# Patient Record
Sex: Female | Born: 1940
Health system: Southern US, Community
[De-identification: ages and names within clinical notes are randomized; demographics above are authoritative.]

## PROBLEM LIST (undated history)

## (undated) DIAGNOSIS — IMO0001 Reserved for inherently not codable concepts without codable children: Secondary | ICD-10-CM

## (undated) DIAGNOSIS — D469 Myelodysplastic syndrome, unspecified: Secondary | ICD-10-CM

## (undated) DIAGNOSIS — K279 Peptic ulcer, site unspecified, unspecified as acute or chronic, without hemorrhage or perforation: Secondary | ICD-10-CM

## (undated) DIAGNOSIS — M47812 Spondylosis without myelopathy or radiculopathy, cervical region: Secondary | ICD-10-CM

## (undated) DIAGNOSIS — M1990 Unspecified osteoarthritis, unspecified site: Secondary | ICD-10-CM

## (undated) DIAGNOSIS — I10 Essential (primary) hypertension: Secondary | ICD-10-CM

## (undated) DIAGNOSIS — S82892A Other fracture of left lower leg, initial encounter for closed fracture: Secondary | ICD-10-CM

## (undated) DIAGNOSIS — M5481 Occipital neuralgia: Secondary | ICD-10-CM

## (undated) DIAGNOSIS — E669 Obesity, unspecified: Secondary | ICD-10-CM

## (undated) DIAGNOSIS — R51 Headache: Secondary | ICD-10-CM

## (undated) DIAGNOSIS — E785 Hyperlipidemia, unspecified: Secondary | ICD-10-CM

## (undated) DIAGNOSIS — R269 Unspecified abnormalities of gait and mobility: Secondary | ICD-10-CM

## (undated) DIAGNOSIS — E559 Vitamin D deficiency, unspecified: Secondary | ICD-10-CM

## (undated) HISTORY — DX: Obesity, unspecified: E66.9

## (undated) HISTORY — PX: LUMBAR LAMINECTOMY: SHX95

## (undated) HISTORY — PX: DILATION AND CURETTAGE OF UTERUS: SHX78

## (undated) HISTORY — PX: ANKLE FRACTURE SURGERY: SHX122

## (undated) HISTORY — DX: Other fracture of left lower leg, initial encounter for closed fracture: S82.892A

## (undated) HISTORY — PX: APPENDECTOMY: SHX54

## (undated) HISTORY — DX: Myelodysplastic syndrome, unspecified: D46.9

## (undated) HISTORY — PX: ROTATOR CUFF REPAIR: SHX139

## (undated) HISTORY — DX: Essential (primary) hypertension: I10

## (undated) HISTORY — DX: Reserved for inherently not codable concepts without codable children: IMO0001

## (undated) HISTORY — PX: ABDOMINAL HYSTERECTOMY: SHX81

## (undated) HISTORY — PX: TUBAL LIGATION: SHX77

## (undated) HISTORY — DX: Unspecified abnormalities of gait and mobility: R26.9

## (undated) HISTORY — DX: Spondylosis without myelopathy or radiculopathy, cervical region: M47.812

## (undated) HISTORY — DX: Unspecified osteoarthritis, unspecified site: M19.90

## (undated) HISTORY — DX: Headache: R51

## (undated) HISTORY — DX: Hyperlipidemia, unspecified: E78.5

## (undated) HISTORY — PX: BREAST LUMPECTOMY: SHX2

## (undated) HISTORY — PX: CATARACT EXTRACTION, BILATERAL: SHX1313

## (undated) HISTORY — DX: Occipital neuralgia: M54.81

## (undated) HISTORY — DX: Peptic ulcer, site unspecified, unspecified as acute or chronic, without hemorrhage or perforation: K27.9

## (undated) HISTORY — PX: TONSILLECTOMY: SUR1361

## (undated) HISTORY — PX: LUMBAR SPINE SURGERY: SHX701

## (undated) HISTORY — DX: Vitamin D deficiency, unspecified: E55.9

---

## 2011-01-20 ENCOUNTER — Other Ambulatory Visit: Payer: Self-pay | Admitting: Oncology

## 2011-01-20 ENCOUNTER — Other Ambulatory Visit (HOSPITAL_COMMUNITY)
Admission: RE | Admit: 2011-01-20 | Discharge: 2011-01-20 | Disposition: A | Discharge status: Home / Self Care | Payer: PRIVATE HEALTH INSURANCE | Source: Ambulatory Visit | Attending: Oncology | Admitting: Oncology

## 2011-01-20 DIAGNOSIS — D709 Neutropenia, unspecified: Secondary | ICD-10-CM | POA: Insufficient documentation

## 2011-01-20 DIAGNOSIS — D61818 Other pancytopenia: Secondary | ICD-10-CM | POA: Insufficient documentation

## 2011-01-20 DIAGNOSIS — D72820 Lymphocytosis (symptomatic): Secondary | ICD-10-CM | POA: Insufficient documentation

## 2011-10-22 DIAGNOSIS — G43019 Migraine without aura, intractable, without status migrainosus: Secondary | ICD-10-CM | POA: Insufficient documentation

## 2011-10-22 DIAGNOSIS — IMO0002 Reserved for concepts with insufficient information to code with codable children: Secondary | ICD-10-CM | POA: Insufficient documentation

## 2011-11-01 HISTORY — PX: THORACIC LAMINECTOMY: SHX96

## 2012-06-08 ENCOUNTER — Encounter: Payer: Self-pay | Admitting: Neurology

## 2012-06-08 ENCOUNTER — Ambulatory Visit (INDEPENDENT_AMBULATORY_CARE_PROVIDER_SITE_OTHER): Payer: Medicare Other | Admitting: Neurology

## 2012-06-08 VITALS — BP 106/63 | HR 72 | Ht <= 58 in | Wt 134.0 lb

## 2012-06-08 DIAGNOSIS — IMO0002 Reserved for concepts with insufficient information to code with codable children: Secondary | ICD-10-CM

## 2012-06-08 DIAGNOSIS — G43019 Migraine without aura, intractable, without status migrainosus: Secondary | ICD-10-CM

## 2012-06-08 MED ORDER — NADOLOL 40 MG PO TABS: ORAL_TABLET | ORAL | Status: DC

## 2012-06-08 MED ORDER — BUTORPHANOL TARTRATE 10 MG/ML NA SOLN: 1.0000 | NASAL | Status: DC | PRN

## 2012-06-08 NOTE — Progress Notes (Signed)
Reason for visit: Headache  Kristin Mendez is an 72 y.o. female  History of present illness:  Kristin Mendez is a 72 year old right-handed white female with a history of chronic daily headaches. The patient has had thoracic spine surgery in September of 2013 since she was seen here last. The patient has had ongoing problems with some discomfort around the right side. The patient continues to have her usual headaches, and she indicates that the headaches have not worsened or improved. Previously, the patient did not respond to Botox therapy, and she could not tolerate the treatments. The patient is on Stadol if needed for her headache. The patient returns to this office for an evaluation.  Past Medical History  Diagnosis Date  . Headache     Chronic daily  . Obesity   . Hypertension   . Myelodysplasia   . Dyslipidemia   . Occipital neuralgia     Right  . Peptic ulcer disease   . Ankle fracture, left   . Arthritis     Past Surgical History  Procedure Laterality Date  . Lumbar spine surgery      5 on occasions  . Abdominal hysterectomy    . Dilation and curettage of uterus    . Tubal ligation Bilateral   . Breast lumpectomy    . Tonsillectomy    . Appendectomy    . Ankle fracture surgery Left   . Rotator cuff repair Left   . Cataract extraction, bilateral    . Lumbar laminectomy      Upper pending  . Thoracic laminectomy  9/13    Family History  Problem Relation Age of Onset  . Alzheimer's disease Mother   . Heart failure Mother   . Heart attack Father   . Diabetes Brother   . Bipolar disorder Brother   . Stroke Maternal Grandfather     Social history:  reports that she has never smoked. She does not have any smokeless tobacco history on file. She reports that she does not drink alcohol or use illicit drugs.  Allergies:  Allergies  Allergen Reactions  . Codeine   . Lyrica (Pregabalin)   . Novocain (Procaine Hcl)     Medications:  No current outpatient  prescriptions on file prior to visit.   No current facility-administered medications on file prior to visit.    ROS:  Out of a complete 14 system review of symptoms, the patient complains only of the following symptoms, and all other reviewed systems are negative.  Ringing in the ears Difficulty swallowing Anemia Easy bruising Increased thirst Headache Snoring  Blood pressure 106/63, pulse 72, height 4\' 9"  (1.448 m), weight 134 lb (60.782 kg).  Physical Exam  General: The patient is alert and cooperative at the time of the examination. The patient is minimally obese.  Skin: No significant peripheral edema is noted.   Neurologic Exam  Cranial nerves: Facial symmetry is present. Speech is normal, no aphasia or dysarthria is noted. Extraocular movements are full. Visual fields are full.  Motor: The patient has good strength in all 4 extremities.  Coordination: The patient has good finger-nose-finger and heel-to-shin bilaterally.  Gait and station: The patient has a normal gait. Tandem gait is slightly unsteady. Romberg is negative, but is slightly unsteady. No drift is seen.  Reflexes: Deep tendon reflexes are symmetric, but are depressed.   Assessment/Plan:  One. Chronic daily headache  2. Myelodysplasia  The patient will continue her current therapies including doxepin and Stadol for  the headache. The patient will followup through this office in about 8 or 9 months. The patient will contact me if she has any concerns or questions.  Marlan Palau MD 06/08/2012 8:27 PM  [facility] [address] [place], [place] [address]  Phone [phone] Fax [phone]

## 2012-06-09 ENCOUNTER — Telehealth: Payer: Self-pay

## 2012-06-09 MED ORDER — BUTORPHANOL TARTRATE 10 MG/ML NA SOLN: 1.0000 | NASAL | Status: DC | PRN

## 2012-06-09 NOTE — Telephone Encounter (Signed)
I got a call from the pharmacy. The prescription for the Stadol was not written properly, the patient is to get no more than 3 bottles of Stadol every 14 days.

## 2012-06-09 NOTE — Telephone Encounter (Signed)
Pharmacy called and left message wanting to clarify Stadol Rx.  They say her previous rx was for 3 bottles per fill, and new rx sent indicates they should dispense 9 bottles. They would like to verify the amount of bottles they should dispense prior to filling medication.  Call back number [phone].  Please advise.  Thank you.

## 2012-08-30 ENCOUNTER — Other Ambulatory Visit: Payer: Self-pay | Admitting: Neurology

## 2012-08-31 ENCOUNTER — Other Ambulatory Visit: Payer: Self-pay

## 2012-08-31 MED ORDER — BUTORPHANOL TARTRATE 10 MG/ML NA SOLN: 1.0000 | NASAL | Status: DC | PRN

## 2012-11-23 ENCOUNTER — Other Ambulatory Visit: Payer: Self-pay | Admitting: Neurology

## 2012-11-23 NOTE — Telephone Encounter (Signed)
Rx signed and faxed.

## 2013-02-08 ENCOUNTER — Encounter: Payer: Self-pay | Admitting: Neurology

## 2013-02-08 ENCOUNTER — Ambulatory Visit (INDEPENDENT_AMBULATORY_CARE_PROVIDER_SITE_OTHER): Payer: Medicare Other | Admitting: Neurology

## 2013-02-08 VITALS — BP 112/64 | HR 66 | Wt 137.0 lb

## 2013-02-08 DIAGNOSIS — G43019 Migraine without aura, intractable, without status migrainosus: Secondary | ICD-10-CM

## 2013-02-08 DIAGNOSIS — IMO0002 Reserved for concepts with insufficient information to code with codable children: Secondary | ICD-10-CM

## 2013-02-08 DIAGNOSIS — M47812 Spondylosis without myelopathy or radiculopathy, cervical region: Secondary | ICD-10-CM

## 2013-02-08 HISTORY — DX: Spondylosis without myelopathy or radiculopathy, cervical region: M47.812

## 2013-02-08 MED ORDER — BUTORPHANOL TARTRATE 10 MG/ML NA SOLN: 1.0000 | NASAL | Status: DC | PRN

## 2013-02-08 NOTE — Progress Notes (Signed)
Reason for visit: Headache  Kristin Mendez is an 72 y.o. female  History of present illness:  Kristin Mendez is a 72 year old right-handed white female with a history of chronic, virtually daily headache. The patient also has significant cervical spondylosis, and many of her headaches come from the back of the neck. The patient indicates that her headaches are no worse and no better. In the past, the patient got ptosis from Botox injections. The patient is on Stadol on a regular basis to help the pain. This seems to be controlling her discomfort over time. The patient reports no other significant new medical issues since last seen with the exception that she did have an episode of bronchitis. The patient returns for an evaluation.  Past Medical History  Diagnosis Date  . Headache(784.0)     Chronic daily  . Obesity   . Hypertension   . Myelodysplasia   . Dyslipidemia   . Occipital neuralgia     Right  . Peptic ulcer disease   . Ankle fracture, left   . Arthritis   . Cervical spondylosis without myelopathy 02/08/2013  . Vitamin D deficiency     Past Surgical History  Procedure Laterality Date  . Lumbar spine surgery      5 on occasions  . Abdominal hysterectomy    . Dilation and curettage of uterus    . Tubal ligation Bilateral   . Breast lumpectomy    . Tonsillectomy    . Appendectomy    . Ankle fracture surgery Left   . Rotator cuff repair Left   . Cataract extraction, bilateral    . Lumbar laminectomy      Upper pending  . Thoracic laminectomy  9/13    Family History  Problem Relation Age of Onset  . Alzheimer's disease Mother   . Heart failure Mother   . Heart attack Father   . Diabetes Brother   . Bipolar disorder Brother   . Stroke Maternal Grandfather     Social history:  reports that she has never smoked. She has never used smokeless tobacco. She reports that she does not drink alcohol or use illicit drugs.    Allergies  Allergen Reactions  . Codeine     . Lyrica [Pregabalin]   . Novocain [Procaine Hcl]     Medications:  Current Outpatient Prescriptions on File Prior to Visit  Medication Sig Dispense Refill  . amLODipine (NORVASC) 5 MG tablet Take 5 mg by mouth daily.      . Calcium Polycarbophil (EQUALACTIN) 625 MG CHEW Chew 625 mg by mouth 2 (two) times daily.      Marland Kitchen dicyclomine (BENTYL) 20 MG tablet Take 20 mg by mouth 3 (three) times daily as needed.      . doxepin (SINEQUAN) 100 MG capsule Take 100 mg by mouth at bedtime.      Marland Kitchen doxepin (SINEQUAN) 25 MG capsule Take 25 mg by mouth 2 (two) times daily.      Marland Kitchen HYDROcodone-acetaminophen (VICODIN) 5-500 MG per tablet Take 1 tablet by mouth every 6 (six) hours as needed for pain.      . hydrOXYzine (ATARAX/VISTARIL) 25 MG tablet Take 25 mg by mouth every 6 (six) hours as needed for itching.      . mometasone (NASONEX) 50 MCG/ACT nasal spray Place 2 sprays into the nose daily.      . nadolol (CORGARD) 40 MG tablet 1 tablet in the morning, one half tablet in the evening      .  nitroGLYCERIN (NITROSTAT) 0.4 MG SL tablet Place 0.4 mg under the tongue every 5 (five) minutes as needed for chest pain.      Marland Kitchen omeprazole (PRILOSEC OTC) 20 MG tablet Take 20 mg by mouth daily.      . rosuvastatin (CRESTOR) 10 MG tablet Take 10 mg by mouth daily.      . ergocalciferol (VITAMIN D2) 50000 UNITS capsule Take 50,000 Units by mouth once a week.      Marland Kitchen ibuprofen (ADVIL,MOTRIN) 200 MG tablet Take 200 mg by mouth every 6 (six) hours as needed for pain.       No current facility-administered medications on file prior to visit.    ROS:  Out of a complete 14 system review of symptoms, the patient complains only of the following symptoms, and all other reviewed systems are negative.  Neck stiffness, neck pain, ringing in the ears Leg swelling Heat intolerance Constipation Joint pain, back pain, neck pain Headache  Blood pressure 112/64, pulse 66, weight 137 lb (62.143 kg).  Physical Exam  General:  The patient is alert and cooperative at the time of the examination. The patient is moderately obese.  Neuromuscular: Range of movement of the cervical spine is limited by 20-25 of full lateral rotation bilaterally.  Skin: 1+ edema of ankles is noted bilaterally.   Neurologic Exam  Mental status: The patient is oriented x 3.  Cranial nerves: Facial symmetry is present. Speech is normal, no aphasia or dysarthria is noted. Extraocular movements are full. Visual fields are full.  Motor: The patient has good strength in all 4 extremities.  Sensory examination: Soft touch sensation on the face, arms, and legs is symmetric.  Coordination: The patient has good finger-nose-finger and heel-to-shin bilaterally.  Gait and station: The patient has a normal gait. Tandem gait is unsteady. Romberg is negative, but is unsteady. No drift is seen.  Reflexes: Deep tendon reflexes are symmetric.   Assessment/Plan:  1. Chronic daily headache  2. Cervical spondylosis  The patient may have some degree of cervicogenic headache. The patient could potentially benefit from a pain center referral in the future, but she is not interested in this at this time. The patient will be continued on the Stadol. The patient will followup through this office in 6-8 months.  Marlan Palau MD 02/08/2013 7:59 PM  [facility] [address] [place], [place] [address]  Phone [phone] Fax [phone]

## 2013-02-08 NOTE — Patient Instructions (Signed)

## 2013-03-24 ENCOUNTER — Telehealth: Payer: Self-pay

## 2013-03-24 ENCOUNTER — Encounter: Payer: Self-pay | Admitting: Neurology

## 2013-03-24 NOTE — Telephone Encounter (Signed)
I called patient. The patient has been on Stadol for 15-20 years. The patient has had good success with treating her headaches. The patient has hydrocodone, but this does not help her headache the patient mainly uses this for her low back pain. I'll try writing a letter concerning this, hopefully overturning the denial. If this does not work, we may try switching the patient to oxycodone or morphine tablets. Codeine is listed as an allergy, this causes nausea.

## 2013-03-24 NOTE — Telephone Encounter (Signed)
Ins mailed [place] a letter of denial.  They state they will only pay for 2 bottles of Stadol per month, and will not approve a higher quantity because the dose is not supported by the manufacturers prescribing info or the Medicare guidelines.  We have even sent them a letter written by the prescriber, but they have still declined the request.  I called and spoke to Kristin Mendez, and he is aware of their decision.  He would like to know if Dr Jannifer Franklin can recommend a different medication for the patient.  Please advise.  Thank you.

## 2013-03-27 NOTE — Telephone Encounter (Signed)
Appeal letter has been sent to ins.

## 2013-03-31 ENCOUNTER — Telehealth: Payer: Self-pay | Admitting: Neurology

## 2013-03-31 NOTE — Telephone Encounter (Addendum)
Called and left message for return call

## 2013-03-31 NOTE — Telephone Encounter (Signed)
This is regarding the appeal for Stadol.  Insurers often ask for chart visit notes to submit to the medical review board.  I called Lorriane Shire back to obtain the fax number they would like the info sent to.  Got no answer.  Left message asking for a return call with this info.

## 2013-03-31 NOTE — Telephone Encounter (Signed)
Lorriane Shire with [employer] called regarding Ms. Holthaus's pain medicine.  They are wanting to make sure that they are being managed properly, because it appears to them that she is on a high dosage.  Lorriane Shire is requesting chart notes from the patient's last office visit.  I did inform her that we may need the patient to come into the office and fill out a form to allow [place] to send that information to them.  Please contact Lorriane Shire directly at [phone].

## 2013-04-03 NOTE — Telephone Encounter (Signed)
I have still not heard back from [place], so I called again.  Got no answer.  Left another message.  Since I am unable to reach her to find out what fax number she needs the info sent to, I am going to fax it to the appeal dept.  I left this info in her message and asked that she call us back if there is a direct fax line we should send it to instead.

## 2013-04-13 ENCOUNTER — Telehealth: Payer: Self-pay | Admitting: Neurology

## 2013-04-13 NOTE — Telephone Encounter (Signed)
Patient's husband is requesting a copy of the letter that was sent to Dr. Jannifer Franklin from the insurance company - they have lost their copy. Pharmacy is showing that butorphanol  it is not fully approved. Please call to advise.

## 2013-04-13 NOTE — Telephone Encounter (Signed)
Insurance mailed [place] a letter saying they have approved coverage on generic Stadol effective until 03/01/2014 Ref # [id number].  The number listed on the letter to call with any questions is [phone].  I called that number, got no answer.  Left message (Kristin Mendez was the agents name) asking that they call us back if they needed another copy of the letter or if anything further is needed from [place].  I called the patient back.  [name] said he gave his copy of the approval letter to the pharmacy and they lost it.  He says the ins was going to pay for 2 bottles monthly, which is why we had to appeal, and he brought the approval letter to the pharmacy, but they told him the ins advised them they only approved 3 bottles per 30 day and they want to be able to get 6 bottles per 30 days.  He wants me to call ins back and verify this info.  The letter does not say how many bttles are approved, it just says approved.  Explained to [name], they may ony allow 3 per month, as the quantity allowed is determined by them, but I will call them again to see if I am able to obtain more info.  I called an alternate number listed in the letter, [phone].  They were not able to assist me, as that line is for medical services only, not prescription drug coverage.  I called Kristin back again.  Spoke with her.  She said the max they would approve is 5 bottles per 30 days.  She tried running a test claim, but said she is having difficulty with her system.  Indicates she will try again later today and if any changes are needed on their end, they will update the info.  I gave her my number and email as a form of contact if needed.  I called the patient back again.  Spoke with [name].  Explained the situation.  He verbalized understanding.

## 2013-04-13 NOTE — Telephone Encounter (Signed)
Patient husband requesting a copy of the insurance letter that was sent concerning the patient's medication butorphanol. Please advise.

## 2013-04-26 ENCOUNTER — Telehealth: Payer: Self-pay | Admitting: Neurology

## 2013-04-26 MED ORDER — BUTORPHANOL TARTRATE 10 MG/ML NA SOLN: 1.0000 | NASAL | Status: DC | PRN

## 2013-04-26 NOTE — Telephone Encounter (Signed)
I will call in a prescription for the Stadol.

## 2013-04-26 NOTE — Telephone Encounter (Signed)
I have tried to call the pharmacy back multiple times, and each time the line has been busy.  Will try again in a few minutes.

## 2013-04-26 NOTE — Telephone Encounter (Signed)
I called and spoke with Erline Levine.  Said she knows we did the prior auth for Stadol.  The patient has been getting 6 bottles per month, and after the appeal, ins said they will pay for 5 bottles per month.  (Patient would like to pay cash for 6th bottle each month).  They need a new Rx written for 6 bottles (15 ml) for a 30 day supply in order for ins to pay.

## 2013-04-26 NOTE — Telephone Encounter (Signed)
Kristin Mendez with [facility] requires 14 days before refill. Can patient get this filled one day early? Tomorrow will be the 14th day. Please call Kristin Mendez back at [phone].

## 2013-05-05 ENCOUNTER — Telehealth: Payer: Self-pay | Admitting: Neurology

## 2013-05-05 MED ORDER — BUTORPHANOL TARTRATE 10 MG/ML NA SOLN: 1.0000 | NASAL | Status: DC | PRN

## 2013-05-05 NOTE — Telephone Encounter (Signed)
Stacy at [facility] called.  She stated that the insurance pays for 5 bottles and the patient would pay for 1 bottle, however the prescription they received at [facility] is for 5 bottles.  Please reference message from 04-13-13.  Thank you

## 2013-05-05 NOTE — Telephone Encounter (Signed)
I called the pharmacy back.  They prefer that we write a separate Rx for the one bottle per month that the patient will pay cash for so it limits the confusion.

## 2013-05-05 NOTE — Telephone Encounter (Signed)
I will write a prescription for one bottle.

## 2013-08-09 ENCOUNTER — Encounter: Payer: Self-pay | Admitting: Neurology

## 2013-08-14 ENCOUNTER — Ambulatory Visit: Payer: Medicare Other | Admitting: Neurology

## 2013-08-23 ENCOUNTER — Ambulatory Visit: Payer: Medicare Other | Admitting: Neurology

## 2013-09-11 ENCOUNTER — Telehealth: Payer: Self-pay | Admitting: *Deleted

## 2013-09-11 NOTE — Telephone Encounter (Signed)
Called patient because she is on the wait list for an appointment with Dr. Jannifer Franklin, offered for patient to see NP MM, patient wanted to see Dr. Jannifer Franklin, she was r/s from 6/23 due to [facility] being out of the office and was scheduled to 02/07/14, [facility] had an opening for 10/26/13 patient was r/s to that time.

## 2013-10-09 ENCOUNTER — Other Ambulatory Visit: Payer: Self-pay | Admitting: Neurology

## 2013-10-09 NOTE — Telephone Encounter (Signed)
Dr [facility] is out of the office.  Forwarding request to [employer] for approval.  

## 2013-10-10 NOTE — Telephone Encounter (Signed)
Rx has been faxed.

## 2013-10-12 ENCOUNTER — Other Ambulatory Visit: Payer: Self-pay | Admitting: Neurology

## 2013-10-12 NOTE — Telephone Encounter (Signed)
Dr Jannifer Franklin is out of the office.  Forwarding request to [facility] for approval.  Patient gets #3 bottles via ins, and pays cash for 1 bottle each month.

## 2013-10-12 NOTE — Telephone Encounter (Signed)
Rx signed and faxed.

## 2013-10-12 NOTE — Telephone Encounter (Signed)
Spouse requesting Rx refill for 1 bottle for butorphanol (STADOL) 10 MG/ML nasal spray.  Pharmacy received Rx for 3 bottles.  Please call and advise anytime, can leave detailed message if not available.  Thanks

## 2013-10-26 ENCOUNTER — Ambulatory Visit (INDEPENDENT_AMBULATORY_CARE_PROVIDER_SITE_OTHER): Payer: Medicare Other | Admitting: Neurology

## 2013-10-26 ENCOUNTER — Encounter: Payer: Self-pay | Admitting: Neurology

## 2013-10-26 VITALS — BP 100/63 | HR 61 | Wt 140.0 lb

## 2013-10-26 DIAGNOSIS — G43019 Migraine without aura, intractable, without status migrainosus: Secondary | ICD-10-CM

## 2013-10-26 DIAGNOSIS — IMO0002 Reserved for concepts with insufficient information to code with codable children: Secondary | ICD-10-CM

## 2013-10-26 DIAGNOSIS — M47812 Spondylosis without myelopathy or radiculopathy, cervical region: Secondary | ICD-10-CM

## 2013-10-26 MED ORDER — BUTORPHANOL TARTRATE 10 MG/ML NA SOLN: 1.0000 | NASAL | Status: DC | PRN

## 2013-10-26 NOTE — Patient Instructions (Signed)

## 2013-10-26 NOTE — Progress Notes (Signed)
Reason for visit: Headache  Kristin Mendez is an 73 y.o. female  History of present illness:  Kristin Mendez is a 73 year old right-handed white female with a history of chronic intractable headache. The patient uses Stadol on a regular basis with some benefit, she is able to function using this drug. The patient has had no real change in her headache frequency or severity. The patient is able to remain somewhat active. The patient has myelodysplasia, and she is being treated for this. The patient reports a lot of fatigue issues, but this is a chronic problem for her. She returns for an evaluation.  Past Medical History  Diagnosis Date  . Headache(784.0)     Chronic daily  . Obesity   . Hypertension   . Myelodysplasia   . Dyslipidemia   . Occipital neuralgia     Right  . Peptic ulcer disease   . Ankle fracture, left   . Arthritis   . Cervical spondylosis without myelopathy 02/08/2013  . Vitamin D deficiency     Past Surgical History  Procedure Laterality Date  . Lumbar spine surgery      5 on occasions  . Abdominal hysterectomy    . Dilation and curettage of uterus    . Tubal ligation Bilateral   . Breast lumpectomy    . Tonsillectomy    . Appendectomy    . Ankle fracture surgery Left   . Rotator cuff repair Left   . Cataract extraction, bilateral    . Lumbar laminectomy      Upper pending  . Thoracic laminectomy  9/13    Family History  Problem Relation Age of Onset  . Alzheimer's disease Mother   . Heart failure Mother   . Heart attack Father   . Diabetes Brother   . Bipolar disorder Brother   . Stroke Maternal Grandfather     Social history:  reports that she has never smoked. She has never used smokeless tobacco. She reports that she does not drink alcohol or use illicit drugs.    Allergies  Allergen Reactions  . Codeine   . Lyrica [Pregabalin]   . Novocain [Procaine Hcl]     Medications:  Current Outpatient Prescriptions on File Prior to Visit    Medication Sig Dispense Refill  . amLODipine (NORVASC) 5 MG tablet Take 5 mg by mouth daily.      . butorphanol (STADOL) 10 MG/ML nasal spray Place 1 spray into the nose every 4 (four) hours as needed for headache (Must last 28 days).  2.5 mL  1  . Calcium Polycarbophil (EQUALACTIN) 625 MG CHEW Chew 625 mg by mouth 2 (two) times daily.      . cholecalciferol (VITAMIN D) 1000 UNITS tablet Take 1,000 Units by mouth daily.      Marland Kitchen dicyclomine (BENTYL) 20 MG tablet Take 20 mg by mouth 3 (three) times daily as needed.      . doxepin (SINEQUAN) 100 MG capsule Take 100 mg by mouth at bedtime.      Marland Kitchen doxepin (SINEQUAN) 25 MG capsule Take 25 mg by mouth 2 (two) times daily.      . ergocalciferol (VITAMIN D2) 50000 UNITS capsule Take 50,000 Units by mouth once a week.      Marland Kitchen HYDROcodone-acetaminophen (VICODIN) 5-500 MG per tablet Take 1 tablet by mouth every 6 (six) hours as needed for pain.      . hydrOXYzine (ATARAX/VISTARIL) 25 MG tablet Take 25 mg by mouth every 6 (  six) hours as needed for itching.      Marland Kitchen ibuprofen (ADVIL,MOTRIN) 200 MG tablet Take 200 mg by mouth every 6 (six) hours as needed for pain.      . mometasone (NASONEX) 50 MCG/ACT nasal spray Place 2 sprays into the nose daily.      . nadolol (CORGARD) 40 MG tablet 1 tablet in the morning, one half tablet in the evening      . nitroGLYCERIN (NITROSTAT) 0.4 MG SL tablet Place 0.4 mg under the tongue every 5 (five) minutes as needed for chest pain.      Marland Kitchen omeprazole (PRILOSEC OTC) 20 MG tablet Take 20 mg by mouth daily.      . rosuvastatin (CRESTOR) 10 MG tablet Take 10 mg by mouth daily.       No current facility-administered medications on file prior to visit.    ROS:  Out of a complete 14 system review of symptoms, the patient complains only of the following symptoms, and all other reviewed systems are negative.  Ringing in the ears Light sensitivity Constipation, diarrhea Environmental allergies Urinary urgency Headache  Blood  pressure 100/63, pulse 61, weight 140 lb (63.504 kg).  Physical Exam  General: The patient is alert and cooperative at the time of the examination.  Skin: No significant peripheral edema is noted.   Neurologic Exam  Mental status: The patient is oriented x 3.  Cranial nerves: Facial symmetry is present. Speech is normal, no aphasia or dysarthria is noted. Extraocular movements are full. Visual fields are full.  Motor: The patient has good strength in all 4 extremities.  Sensory examination: Soft touch sensation is symmetric on the face, arms, and legs.  Coordination: The patient has good finger-nose-finger and heel-to-shin bilaterally.  Gait and station: The patient has a normal gait. Tandem gait is slightly unsteady. Romberg is negative. No drift is seen.  Reflexes: Deep tendon reflexes are symmetric.   Assessment/Plan:  1. Intractable headache  The patient will continue her current medications without change. The patient will followup through this office in about 6-8 months. They will contact me if any new issues arise.  Jill Alexanders MD 10/26/2013 8:37 PM  [facility] [address] [place], [place] [address]  Phone [phone] Fax [phone]

## 2013-11-30 ENCOUNTER — Other Ambulatory Visit: Payer: Self-pay | Admitting: Neurology

## 2013-12-01 NOTE — Telephone Encounter (Signed)
Rx signed and faxed.

## 2013-12-18 ENCOUNTER — Telehealth: Payer: Self-pay | Admitting: Neurology

## 2013-12-18 NOTE — Telephone Encounter (Signed)
Patient's spouse called back and stated patient is completely out of medication.

## 2013-12-18 NOTE — Telephone Encounter (Signed)
Patient's husband requesting another call back, please return call and advise.

## 2013-12-18 NOTE — Telephone Encounter (Signed)
I spoke with Kristin Mendez, he is at the pharmacy and they have everything straightened out.  Nothing further is needed at this time.

## 2013-12-18 NOTE — Telephone Encounter (Signed)
I called back and spoke with Ms Templer.  She would prefer we speak with Mr Daubert, who is not home at this time.

## 2013-12-18 NOTE — Telephone Encounter (Signed)
Patient's spouse requesting Rx refill for butorphanol (STADOL) 10 MG/ML nasal spray.  Patient has received 1 bottle that they pay for, but insurance pays for 5 bottles/monthly.  Please call and advise.

## 2013-12-18 NOTE — Telephone Encounter (Signed)
We sent [facility] 6 refills on 10/01.  I called the pharmacy.  Spoke with Helene Kelp.  She said they did have the Rx on file, it was just saved.  They will process Rx today and contact patient when it's ready for pick up.  I called the patient back.  They are aware pharmacy has Rx.

## 2014-01-17 ENCOUNTER — Encounter: Payer: Self-pay | Admitting: Neurology

## 2014-01-23 ENCOUNTER — Encounter: Payer: Self-pay | Admitting: Neurology

## 2014-02-07 ENCOUNTER — Ambulatory Visit: Payer: Medicare Other | Admitting: Neurology

## 2014-03-06 DIAGNOSIS — E780 Pure hypercholesterolemia: Secondary | ICD-10-CM | POA: Diagnosis not present

## 2014-03-06 DIAGNOSIS — G43909 Migraine, unspecified, not intractable, without status migrainosus: Secondary | ICD-10-CM | POA: Diagnosis not present

## 2014-03-06 DIAGNOSIS — I10 Essential (primary) hypertension: Secondary | ICD-10-CM | POA: Diagnosis not present

## 2014-03-27 DIAGNOSIS — K589 Irritable bowel syndrome without diarrhea: Secondary | ICD-10-CM | POA: Diagnosis not present

## 2014-03-27 DIAGNOSIS — K222 Esophageal obstruction: Secondary | ICD-10-CM | POA: Diagnosis not present

## 2014-03-27 DIAGNOSIS — K219 Gastro-esophageal reflux disease without esophagitis: Secondary | ICD-10-CM | POA: Diagnosis not present

## 2014-03-28 DIAGNOSIS — M542 Cervicalgia: Secondary | ICD-10-CM | POA: Diagnosis not present

## 2014-03-28 DIAGNOSIS — M25512 Pain in left shoulder: Secondary | ICD-10-CM | POA: Diagnosis not present

## 2014-03-28 DIAGNOSIS — M7552 Bursitis of left shoulder: Secondary | ICD-10-CM | POA: Diagnosis not present

## 2014-03-28 DIAGNOSIS — M1288 Other specific arthropathies, not elsewhere classified, other specified site: Secondary | ICD-10-CM | POA: Diagnosis not present

## 2014-04-04 DIAGNOSIS — R1314 Dysphagia, pharyngoesophageal phase: Secondary | ICD-10-CM | POA: Diagnosis not present

## 2014-04-04 DIAGNOSIS — K222 Esophageal obstruction: Secondary | ICD-10-CM | POA: Diagnosis not present

## 2014-04-11 DIAGNOSIS — D696 Thrombocytopenia, unspecified: Secondary | ICD-10-CM | POA: Diagnosis not present

## 2014-04-11 DIAGNOSIS — D461 Refractory anemia with ring sideroblasts: Secondary | ICD-10-CM | POA: Diagnosis not present

## 2014-04-25 ENCOUNTER — Ambulatory Visit (INDEPENDENT_AMBULATORY_CARE_PROVIDER_SITE_OTHER): Payer: Medicare Other | Admitting: Neurology

## 2014-04-25 ENCOUNTER — Encounter: Payer: Self-pay | Admitting: Neurology

## 2014-04-25 VITALS — BP 123/55 | HR 59 | Ht <= 58 in | Wt 142.4 lb

## 2014-04-25 DIAGNOSIS — G43019 Migraine without aura, intractable, without status migrainosus: Secondary | ICD-10-CM

## 2014-04-25 DIAGNOSIS — M47812 Spondylosis without myelopathy or radiculopathy, cervical region: Secondary | ICD-10-CM | POA: Diagnosis not present

## 2014-04-25 MED ORDER — BUTORPHANOL TARTRATE 10 MG/ML NA SOLN: 1.0000 | NASAL | Status: DC | PRN

## 2014-04-25 NOTE — Patient Instructions (Signed)

## 2014-04-25 NOTE — Progress Notes (Signed)
Reason for visit: Headache  Kristin Mendez is an 74 y.o. female  History of present illness:  Kristin Mendez is a 74 year old right-handed white female with a history of chronic daily headache. The patient has ongoing migraine headache. The patient is on nadolol and she takes Stadol if needed. The patient continues to have severe headaches at times, and she cannot function during these headaches. Overall, the medication helps her get through the day most of the time. The patient denies any new medical issues that have come up since last seen. The patient does have some neck pain and some occipital pain. She fell about 1 month ago, and hit the back of her head. She reported some numbness in the hands that was transient in nature following the fall. She did not seek medical attention. She returns for an evaluation.  Past Medical History  Diagnosis Date  . Headache(784.0)     Chronic daily  . Obesity   . Hypertension   . Myelodysplasia   . Dyslipidemia   . Occipital neuralgia     Right  . Peptic ulcer disease   . Ankle fracture, left   . Arthritis   . Cervical spondylosis without myelopathy 02/08/2013  . Vitamin D deficiency     Past Surgical History  Procedure Laterality Date  . Lumbar spine surgery      5 on occasions  . Abdominal hysterectomy    . Dilation and curettage of uterus    . Tubal ligation Bilateral   . Breast lumpectomy    . Tonsillectomy    . Appendectomy    . Ankle fracture surgery Left   . Rotator cuff repair Left   . Cataract extraction, bilateral    . Lumbar laminectomy      Upper pending  . Thoracic laminectomy  9/13    Family History  Problem Relation Age of Onset  . Alzheimer's disease Mother   . Heart failure Mother   . Heart attack Father   . Diabetes Brother   . Bipolar disorder Brother   . Stroke Maternal Grandfather     Social history:  reports that she has never smoked. She has never used smokeless tobacco. She reports that she does not  drink alcohol or use illicit drugs.    Allergies  Allergen Reactions  . Codeine   . Lyrica [Pregabalin]   . Novocain [Procaine Hcl]     Medications:  Current Outpatient Prescriptions on File Prior to Visit  Medication Sig Dispense Refill  . amLODipine (NORVASC) 5 MG tablet Take 5 mg by mouth daily.    . butorphanol (STADOL) 10 MG/ML nasal spray Place 1 spray into the nose every 4 (four) hours as needed for headache.    . dicyclomine (BENTYL) 20 MG tablet Take 20 mg by mouth 3 (three) times daily as needed.    . doxepin (SINEQUAN) 100 MG capsule Take 100 mg by mouth at bedtime.    Marland Kitchen doxepin (SINEQUAN) 25 MG capsule Take 25 mg by mouth 2 (two) times daily.    . mometasone (NASONEX) 50 MCG/ACT nasal spray Place 2 sprays into the nose daily.    . nadolol (CORGARD) 40 MG tablet 1 tablet in the morning, one half tablet in the evening    . nitroGLYCERIN (NITROSTAT) 0.4 MG SL tablet Place 0.4 mg under the tongue every 5 (five) minutes as needed for chest pain.    Marland Kitchen omeprazole (PRILOSEC OTC) 20 MG tablet Take 20 mg by mouth daily.    Marland Kitchen  rosuvastatin (CRESTOR) 10 MG tablet Take 10 mg by mouth daily.     No current facility-administered medications on file prior to visit.    ROS:  Out of a complete 14 system review of symptoms, the patient complains only of the following symptoms, and all other reviewed systems are negative.  Headache Back pain  Blood pressure 123/55, pulse 59, height 4\' 8"  (1.422 m), weight 142 lb 6.4 oz (64.592 kg).  Physical Exam  General: The patient is alert and cooperative at the time of the examination.  Skin: No significant peripheral edema is noted.   Neurologic Exam  Mental status: The patient is oriented x 3.  Cranial nerves: Facial symmetry is present. Speech is normal, no aphasia or dysarthria is noted. Extraocular movements are full. Visual fields are full.  Motor: The patient has good strength in all 4 extremities.  Sensory examination: Soft  touch sensation is symmetric on the face, arms, and legs.  Coordination: The patient has good finger-nose-finger and heel-to-shin bilaterally.  Gait and station: The patient has a normal gait. Tandem gait is unsteady. Romberg is negative. No drift is seen.  Reflexes: Deep tendon reflexes are symmetric.   Assessment/Plan:  1. Chronic daily headache  The patient will continue the Stadol if needed. She will follow-up through this office in 6-8 months. She will contact our office if new issues arise.  Jill Alexanders MD 04/25/2014 8:59 PM  [facility] [address] [place], [place] [address]  Phone [phone] Fax [phone]

## 2014-05-09 DIAGNOSIS — M461 Sacroiliitis, not elsewhere classified: Secondary | ICD-10-CM | POA: Diagnosis not present

## 2014-05-09 DIAGNOSIS — M545 Low back pain: Secondary | ICD-10-CM | POA: Diagnosis not present

## 2014-05-20 DIAGNOSIS — N3090 Cystitis, unspecified without hematuria: Secondary | ICD-10-CM | POA: Diagnosis not present

## 2014-05-20 DIAGNOSIS — N3001 Acute cystitis with hematuria: Secondary | ICD-10-CM | POA: Diagnosis not present

## 2014-05-24 ENCOUNTER — Other Ambulatory Visit: Payer: Self-pay

## 2014-05-24 MED ORDER — BUTORPHANOL TARTRATE 10 MG/ML NA SOLN: 1.0000 | NASAL | Status: DC | PRN

## 2014-05-24 NOTE — Telephone Encounter (Signed)
Patient requests 2 separate Rx's for insurance reasons.  (Rx from 02/24 was set to "no print")

## 2014-05-25 NOTE — Telephone Encounter (Signed)
Rx signed and faxed.

## 2014-07-04 DIAGNOSIS — D469 Myelodysplastic syndrome, unspecified: Secondary | ICD-10-CM | POA: Diagnosis not present

## 2014-07-25 DIAGNOSIS — M25561 Pain in right knee: Secondary | ICD-10-CM | POA: Diagnosis not present

## 2014-07-25 DIAGNOSIS — M25531 Pain in right wrist: Secondary | ICD-10-CM | POA: Diagnosis not present

## 2014-07-25 DIAGNOSIS — M25562 Pain in left knee: Secondary | ICD-10-CM | POA: Diagnosis not present

## 2014-07-25 DIAGNOSIS — W108XXA Fall (on) (from) other stairs and steps, initial encounter: Secondary | ICD-10-CM | POA: Diagnosis not present

## 2014-08-13 DIAGNOSIS — G8929 Other chronic pain: Secondary | ICD-10-CM | POA: Diagnosis not present

## 2014-08-13 DIAGNOSIS — Z79899 Other long term (current) drug therapy: Secondary | ICD-10-CM | POA: Diagnosis not present

## 2014-08-13 DIAGNOSIS — I10 Essential (primary) hypertension: Secondary | ICD-10-CM | POA: Diagnosis not present

## 2014-08-13 DIAGNOSIS — M549 Dorsalgia, unspecified: Secondary | ICD-10-CM | POA: Diagnosis not present

## 2014-08-13 DIAGNOSIS — G43909 Migraine, unspecified, not intractable, without status migrainosus: Secondary | ICD-10-CM | POA: Diagnosis not present

## 2014-08-27 ENCOUNTER — Other Ambulatory Visit: Payer: Self-pay

## 2014-09-29 DIAGNOSIS — M7989 Other specified soft tissue disorders: Secondary | ICD-10-CM | POA: Diagnosis not present

## 2014-10-01 DIAGNOSIS — E780 Pure hypercholesterolemia: Secondary | ICD-10-CM | POA: Diagnosis not present

## 2014-10-01 DIAGNOSIS — R51 Headache: Secondary | ICD-10-CM | POA: Diagnosis not present

## 2014-10-01 DIAGNOSIS — I10 Essential (primary) hypertension: Secondary | ICD-10-CM | POA: Diagnosis not present

## 2014-10-01 DIAGNOSIS — Z79899 Other long term (current) drug therapy: Secondary | ICD-10-CM | POA: Diagnosis not present

## 2014-10-03 DIAGNOSIS — M79605 Pain in left leg: Secondary | ICD-10-CM | POA: Diagnosis not present

## 2014-10-03 DIAGNOSIS — D649 Anemia, unspecified: Secondary | ICD-10-CM | POA: Diagnosis not present

## 2014-10-03 DIAGNOSIS — D469 Myelodysplastic syndrome, unspecified: Secondary | ICD-10-CM | POA: Diagnosis not present

## 2014-10-03 DIAGNOSIS — M7989 Other specified soft tissue disorders: Secondary | ICD-10-CM | POA: Diagnosis not present

## 2014-10-04 DIAGNOSIS — D461 Refractory anemia with ring sideroblasts: Secondary | ICD-10-CM | POA: Diagnosis not present

## 2014-10-18 DIAGNOSIS — K589 Irritable bowel syndrome without diarrhea: Secondary | ICD-10-CM | POA: Diagnosis not present

## 2014-10-18 DIAGNOSIS — K222 Esophageal obstruction: Secondary | ICD-10-CM | POA: Diagnosis not present

## 2014-10-29 ENCOUNTER — Other Ambulatory Visit: Payer: Self-pay | Admitting: Neurology

## 2014-10-30 NOTE — Telephone Encounter (Signed)
Rx signed and faxed.

## 2014-11-01 DIAGNOSIS — R600 Localized edema: Secondary | ICD-10-CM | POA: Diagnosis not present

## 2014-11-01 DIAGNOSIS — L219 Seborrheic dermatitis, unspecified: Secondary | ICD-10-CM | POA: Diagnosis not present

## 2014-11-06 DIAGNOSIS — Z79899 Other long term (current) drug therapy: Secondary | ICD-10-CM | POA: Diagnosis not present

## 2014-11-06 DIAGNOSIS — Z23 Encounter for immunization: Secondary | ICD-10-CM | POA: Diagnosis not present

## 2014-11-06 DIAGNOSIS — I119 Hypertensive heart disease without heart failure: Secondary | ICD-10-CM | POA: Diagnosis not present

## 2014-11-06 DIAGNOSIS — E8881 Metabolic syndrome: Secondary | ICD-10-CM | POA: Diagnosis not present

## 2014-11-06 DIAGNOSIS — R600 Localized edema: Secondary | ICD-10-CM | POA: Diagnosis not present

## 2014-11-07 DIAGNOSIS — K222 Esophageal obstruction: Secondary | ICD-10-CM | POA: Diagnosis not present

## 2014-11-07 DIAGNOSIS — R1310 Dysphagia, unspecified: Secondary | ICD-10-CM | POA: Diagnosis not present

## 2014-11-27 DIAGNOSIS — Z1231 Encounter for screening mammogram for malignant neoplasm of breast: Secondary | ICD-10-CM | POA: Diagnosis not present

## 2014-12-21 ENCOUNTER — Other Ambulatory Visit: Payer: Self-pay

## 2014-12-21 MED ORDER — BUTORPHANOL TARTRATE 10 MG/ML NA SOLN: NASAL | Status: DC

## 2014-12-21 NOTE — Telephone Encounter (Signed)
Rx signed and faxed.

## 2014-12-26 ENCOUNTER — Ambulatory Visit (INDEPENDENT_AMBULATORY_CARE_PROVIDER_SITE_OTHER): Payer: Medicare Other | Admitting: Neurology

## 2014-12-26 ENCOUNTER — Encounter: Payer: Self-pay | Admitting: Neurology

## 2014-12-26 VITALS — BP 143/75 | HR 75 | Ht <= 58 in | Wt 143.0 lb

## 2014-12-26 DIAGNOSIS — G43019 Migraine without aura, intractable, without status migrainosus: Secondary | ICD-10-CM

## 2014-12-26 DIAGNOSIS — M47812 Spondylosis without myelopathy or radiculopathy, cervical region: Secondary | ICD-10-CM | POA: Diagnosis not present

## 2014-12-26 NOTE — Patient Instructions (Signed)

## 2014-12-26 NOTE — Progress Notes (Signed)
Reason for visit: Headache  Kristin Mendez is an 74 y.o. female  History of present illness:  Ms. Holstein is a 74 year old right-handed white female with a history of chronic headaches. The patient seems to have fairly frequent headaches, she indicates that during the summers the headaches were quite severe. The patient is quite sensitive to weather changes. The patient did have a fall down a flight of stairs, and fortunately she did not sustain significant injury. She has had some swelling in the legs, she has been taken off of Norvasc which was quite beneficial. The patient is stable, she takes doxepin at night, and she is on a beta blocker. The patient is taking Stadol if needed. She returns for an evaluation.  Past Medical History  Diagnosis Date  . Headache(784.0)     Chronic daily  . Obesity   . Hypertension   . Myelodysplasia   . Dyslipidemia   . Occipital neuralgia     Right  . Peptic ulcer disease   . Ankle fracture, left   . Arthritis   . Cervical spondylosis without myelopathy 02/08/2013  . Vitamin D deficiency     Past Surgical History  Procedure Laterality Date  . Lumbar spine surgery      5 on occasions  . Abdominal hysterectomy    . Dilation and curettage of uterus    . Tubal ligation Bilateral   . Breast lumpectomy    . Tonsillectomy    . Appendectomy    . Ankle fracture surgery Left   . Rotator cuff repair Left   . Cataract extraction, bilateral    . Lumbar laminectomy      Upper pending  . Thoracic laminectomy  9/13    Family History  Problem Relation Age of Onset  . Alzheimer's disease Mother   . Heart failure Mother   . Heart attack Father   . Diabetes Brother   . Bipolar disorder Brother   . Stroke Maternal Grandfather     Social history:  reports that she has never smoked. She has never used smokeless tobacco. She reports that she does not drink alcohol or use illicit drugs.    Allergies  Allergen Reactions  . Codeine   . Lyrica  [Pregabalin]   . Novocain [Procaine Hcl]     Medications:  Prior to Admission medications   Medication Sig Start Date End Date Taking? Authorizing Provider  butorphanol (STADOL) 10 MG/ML nasal spray Place 1 spray into the nose every 4 (four) hours as needed for headache. 05/24/14  Yes Kathrynn Ducking, MD  butorphanol (STADOL) 10 MG/ML nasal spray PLACE 1 SPRAY INTO THE NOSE EVERY 4 HOURS AS NEEDED FOR HEADACHE. 12/21/14  Yes Kathrynn Ducking, MD  butorphanol (STADOL) 10 MG/ML nasal spray PLACE 1 SPRAY INTO THE NOSE EVERY 4 HOURS AS NEEDED FOR HEADACHE. 12/21/14  Yes Kathrynn Ducking, MD  dicyclomine (BENTYL) 20 MG tablet Take 20 mg by mouth 3 (three) times daily as needed.   Yes Historical Provider, MD  doxepin (SINEQUAN) 100 MG capsule Take 100 mg by mouth at bedtime.   Yes Historical Provider, MD  doxepin (SINEQUAN) 25 MG capsule Take 25 mg by mouth 2 (two) times daily.   Yes Historical Provider, MD  HYDROcodone-acetaminophen (NORCO/VICODIN) 5-325 MG per tablet Take 1 tablet by mouth every 6 (six) hours as needed for moderate pain.   Yes Historical Provider, MD  mometasone (NASONEX) 50 MCG/ACT nasal spray Place 2 sprays into the  nose daily.   Yes Historical Provider, MD  nadolol (CORGARD) 40 MG tablet 1 tablet in the morning, one half tablet in the evening 06/08/12  Yes Kathrynn Ducking, MD  nitroGLYCERIN (NITROSTAT) 0.4 MG SL tablet Place 0.4 mg under the tongue every 5 (five) minutes as needed for chest pain.   Yes Historical Provider, MD  omeprazole (PRILOSEC OTC) 20 MG tablet Take 20 mg by mouth daily.   Yes Historical Provider, MD  rosuvastatin (CRESTOR) 10 MG tablet Take 10 mg by mouth daily.   Yes Historical Provider, MD  tiZANidine (ZANAFLEX) 4 MG tablet Take 4 mg by mouth every 8 (eight) hours as needed for muscle spasms.   Yes Historical Provider, MD    ROS:  Out of a complete 14 system review of symptoms, the patient complains only of the following symptoms, and all other reviewed  systems are negative.  Headache  Blood pressure 143/75, pulse 75, height 4\' 9"  (1.448 m), weight 143 lb (64.864 kg).  Physical Exam  General: The patient is alert and cooperative at the time of the examination.  Skin: No significant peripheral edema is noted.   Neurologic Exam  Mental status: The patient is alert and oriented x 3 at the time of the examination. The patient has apparent normal recent and remote memory, with an apparently normal attention span and concentration ability.   Cranial nerves: Facial symmetry is present. Speech is normal, no aphasia or dysarthria is noted. Extraocular movements are full. Visual fields are full.  Motor: The patient has good strength in all 4 extremities.  Sensory examination: Soft touch sensation is symmetric on the face, arms, and legs.  Coordination: The patient has good finger-nose-finger and heel-to-shin bilaterally.  Gait and station: The patient has a normal gait. Tandem gait is normal. Romberg is negative. No drift is seen.  Reflexes: Deep tendon reflexes are symmetric, but are depressed.   Assessment/Plan:  1. Intractable migraine  The patient remains relatively stable with her headaches. She will continue on the current medication regimen, she will follow-up in 6-8 months, sooner if needed. The patient has gotten a recent prescription for the Stadol.  Jill Alexanders MD 12/26/2014 6:55 PM  [facility] [address] [place], [place] [address]  Phone [phone] Fax [phone]

## 2015-01-03 DIAGNOSIS — D461 Refractory anemia with ring sideroblasts: Secondary | ICD-10-CM | POA: Diagnosis not present

## 2015-01-04 DIAGNOSIS — D461 Refractory anemia with ring sideroblasts: Secondary | ICD-10-CM | POA: Diagnosis not present

## 2015-01-09 DIAGNOSIS — M461 Sacroiliitis, not elsewhere classified: Secondary | ICD-10-CM | POA: Diagnosis not present

## 2015-01-09 DIAGNOSIS — M545 Low back pain: Secondary | ICD-10-CM | POA: Diagnosis not present

## 2015-01-15 ENCOUNTER — Other Ambulatory Visit: Payer: Self-pay | Admitting: Neurology

## 2015-01-16 NOTE — Telephone Encounter (Signed)
Rx signed and faxed.

## 2015-03-06 DIAGNOSIS — G43909 Migraine, unspecified, not intractable, without status migrainosus: Secondary | ICD-10-CM | POA: Diagnosis not present

## 2015-03-06 DIAGNOSIS — Z79899 Other long term (current) drug therapy: Secondary | ICD-10-CM | POA: Diagnosis not present

## 2015-03-06 DIAGNOSIS — E7800 Pure hypercholesterolemia, unspecified: Secondary | ICD-10-CM | POA: Diagnosis not present

## 2015-03-06 DIAGNOSIS — I10 Essential (primary) hypertension: Secondary | ICD-10-CM | POA: Diagnosis not present

## 2015-03-20 DIAGNOSIS — D4620 Refractory anemia with excess of blasts, unspecified: Secondary | ICD-10-CM

## 2015-03-20 DIAGNOSIS — D461 Refractory anemia with ring sideroblasts: Secondary | ICD-10-CM | POA: Diagnosis not present

## 2015-03-21 DIAGNOSIS — D469 Myelodysplastic syndrome, unspecified: Secondary | ICD-10-CM | POA: Diagnosis not present

## 2015-05-01 DIAGNOSIS — M461 Sacroiliitis, not elsewhere classified: Secondary | ICD-10-CM | POA: Diagnosis not present

## 2015-05-01 DIAGNOSIS — M545 Low back pain: Secondary | ICD-10-CM | POA: Diagnosis not present

## 2015-05-22 DIAGNOSIS — I10 Essential (primary) hypertension: Secondary | ICD-10-CM | POA: Diagnosis not present

## 2015-05-22 DIAGNOSIS — D461 Refractory anemia with ring sideroblasts: Secondary | ICD-10-CM | POA: Diagnosis not present

## 2015-05-30 DIAGNOSIS — K222 Esophageal obstruction: Secondary | ICD-10-CM | POA: Diagnosis not present

## 2015-05-30 DIAGNOSIS — K589 Irritable bowel syndrome without diarrhea: Secondary | ICD-10-CM | POA: Diagnosis not present

## 2015-05-30 DIAGNOSIS — K219 Gastro-esophageal reflux disease without esophagitis: Secondary | ICD-10-CM | POA: Diagnosis not present

## 2015-06-12 DIAGNOSIS — M545 Low back pain: Secondary | ICD-10-CM | POA: Diagnosis not present

## 2015-06-17 DIAGNOSIS — K219 Gastro-esophageal reflux disease without esophagitis: Secondary | ICD-10-CM | POA: Diagnosis not present

## 2015-06-17 DIAGNOSIS — K222 Esophageal obstruction: Secondary | ICD-10-CM | POA: Diagnosis not present

## 2015-06-17 DIAGNOSIS — R1310 Dysphagia, unspecified: Secondary | ICD-10-CM | POA: Diagnosis not present

## 2015-06-18 ENCOUNTER — Other Ambulatory Visit: Payer: Self-pay | Admitting: Neurology

## 2015-06-20 MED ORDER — BUTORPHANOL TARTRATE 10 MG/ML NA SOLN: 1.0000 | NASAL | Status: DC | PRN

## 2015-06-20 NOTE — Addendum Note (Signed)
Addended by: Margette Fast on: 06/20/2015 12:18 PM   Modules accepted: Orders

## 2015-06-20 NOTE — Telephone Encounter (Signed)
Rx printed, signed, up front for pick-up. 

## 2015-06-20 NOTE — Telephone Encounter (Signed)
The Stadol will be refilled. 

## 2015-06-20 NOTE — Telephone Encounter (Signed)
Last OV was 12/2014 w/ 6 mo follow-up scheduled 07/10/15

## 2015-06-21 ENCOUNTER — Other Ambulatory Visit: Payer: Self-pay | Admitting: Neurology

## 2015-06-21 ENCOUNTER — Other Ambulatory Visit: Payer: Self-pay

## 2015-06-21 DIAGNOSIS — Z1389 Encounter for screening for other disorder: Secondary | ICD-10-CM | POA: Diagnosis not present

## 2015-06-21 DIAGNOSIS — E785 Hyperlipidemia, unspecified: Secondary | ICD-10-CM | POA: Diagnosis not present

## 2015-06-21 DIAGNOSIS — D539 Nutritional anemia, unspecified: Secondary | ICD-10-CM | POA: Diagnosis not present

## 2015-06-21 DIAGNOSIS — Z0000 Encounter for general adult medical examination without abnormal findings: Secondary | ICD-10-CM | POA: Diagnosis not present

## 2015-06-21 DIAGNOSIS — Z79899 Other long term (current) drug therapy: Secondary | ICD-10-CM | POA: Diagnosis not present

## 2015-06-21 DIAGNOSIS — H6123 Impacted cerumen, bilateral: Secondary | ICD-10-CM | POA: Diagnosis not present

## 2015-06-21 NOTE — Telephone Encounter (Signed)
Rx faxed as requested. Called to notify pt's husband.

## 2015-06-21 NOTE — Telephone Encounter (Signed)
Pt's husband called to see if medication could be faxed to pharmacy. [facility] in [place]. Please call pt to update. Pt has been out of medication for a couple of days.

## 2015-06-24 NOTE — Telephone Encounter (Signed)
Patient's husband is calling in regard to butorphanol(Stadol)10 mg.  He states he has the Rx for one 2.5 mil, 3 refills that he pays for.  He says that normally there is a second Rx for the butorphanol for 6 refills that the insurance company pays for which is faxed to [place], [place].   Please fill and call patient.

## 2015-06-24 NOTE — Telephone Encounter (Signed)
Additional rx faxed to pharmacy to equal 5 refills. Pt pays for 1 bottle and insurance pays for 5. Called and let pt's husband know. Voiced appreciation for call.

## 2015-06-24 NOTE — Telephone Encounter (Signed)
Spouse called back to inquire about the status of the 2.5 mil of Butorphanol (STADOL), states they usually get 5 bottles at one time, to [place], [place]. Please call to advise.

## 2015-06-25 MED ORDER — BUTORPHANOL TARTRATE 10 MG/ML NA SOLN: NASAL | Status: DC

## 2015-06-25 NOTE — Telephone Encounter (Addendum)
Called and spoke to pharmacy who reported that pt needed additional rx for 15 ml bottles w/ 5 refills to be covered by insurance. Rx printed, awaiting signature.

## 2015-06-25 NOTE — Telephone Encounter (Addendum)
Rx signed, faxed to pharmacy. Husband notified via TC.

## 2015-06-25 NOTE — Addendum Note (Signed)
Addended by: Monte Fantasia on: 06/25/2015 11:04 AM   Modules accepted: Orders

## 2015-06-25 NOTE — Telephone Encounter (Signed)
Pt's husband called today said that both RX that was faxed to pharmacy reads with one refill. Please call him.

## 2015-07-10 ENCOUNTER — Encounter: Payer: Self-pay | Admitting: Neurology

## 2015-07-10 ENCOUNTER — Ambulatory Visit (INDEPENDENT_AMBULATORY_CARE_PROVIDER_SITE_OTHER): Payer: Medicare Other | Admitting: Neurology

## 2015-07-10 VITALS — BP 114/64 | HR 64 | Ht <= 58 in | Wt 137.5 lb

## 2015-07-10 DIAGNOSIS — E538 Deficiency of other specified B group vitamins: Secondary | ICD-10-CM | POA: Diagnosis not present

## 2015-07-10 DIAGNOSIS — R5382 Chronic fatigue, unspecified: Secondary | ICD-10-CM | POA: Diagnosis not present

## 2015-07-10 DIAGNOSIS — G43019 Migraine without aura, intractable, without status migrainosus: Secondary | ICD-10-CM

## 2015-07-10 DIAGNOSIS — M47812 Spondylosis without myelopathy or radiculopathy, cervical region: Secondary | ICD-10-CM | POA: Diagnosis not present

## 2015-07-10 DIAGNOSIS — R269 Unspecified abnormalities of gait and mobility: Secondary | ICD-10-CM | POA: Diagnosis not present

## 2015-07-10 HISTORY — DX: Unspecified abnormalities of gait and mobility: R26.9

## 2015-07-10 NOTE — Progress Notes (Signed)
Reason for visit: Headache  Kristin Mendez is an 75 y.o. female  History of present illness:  Kristin Mendez is a 75 year old right-handed white female with a history of chronic daily headaches. The patient has had no real change in her headaches, she does have severe headaches at times. The patient uses Stadol on a regular basis for this. The patient has developed a new problem. She indicates that she has had multiple falls within the last several months. The last fall was 2 weeks ago. The patient has had a chronic issue with a mild right foot drop following the delivery of a child in the distant past. The patient may occasionally catch the toe, but not usually. The patient may fall for no apparent reason. She has severe scoliosis, she has a tendency to lean to the right. She does not use a cane for ambulation. She denies any numbness of the feet. She returns to this office for an evaluation.  Past Medical History  Diagnosis Date  . Headache(784.0)     Chronic daily  . Obesity   . Hypertension   . Myelodysplasia   . Dyslipidemia   . Occipital neuralgia     Right  . Peptic ulcer disease   . Ankle fracture, left   . Arthritis   . Cervical spondylosis without myelopathy 02/08/2013  . Vitamin D deficiency   . Abnormality of gait 07/10/2015    Past Surgical History  Procedure Laterality Date  . Lumbar spine surgery      5 on occasions  . Abdominal hysterectomy    . Dilation and curettage of uterus    . Tubal ligation Bilateral   . Breast lumpectomy    . Tonsillectomy    . Appendectomy    . Ankle fracture surgery Left   . Rotator cuff repair Left   . Cataract extraction, bilateral    . Lumbar laminectomy      Upper pending  . Thoracic laminectomy  9/13    Family History  Problem Relation Age of Onset  . Alzheimer's disease Mother   . Heart failure Mother   . Heart attack Father   . Diabetes Brother   . Bipolar disorder Brother   . Stroke Maternal Grandfather     Social  history:  reports that she has never smoked. She has never used smokeless tobacco. She reports that she does not drink alcohol or use illicit drugs.    Allergies  Allergen Reactions  . Codeine   . Lyrica [Pregabalin]   . Novocain [Procaine Hcl]     Medications:  Prior to Admission medications   Medication Sig Start Date End Date Taking? Authorizing Provider  butorphanol (STADOL) 10 MG/ML nasal spray USE ONE DOSE EVERY 4 HOURS AS NEEDED FOR HEADACHE 06/24/15  Yes Kathrynn Ducking, MD  butorphanol (STADOL) 10 MG/ML nasal spray USE ONE DOSE EVERY 4 HOURS AS NEEDED FOR HEADACHE 06/25/15  Yes Britt Bottom, MD  dicyclomine (BENTYL) 20 MG tablet Take 20 mg by mouth 3 (three) times daily as needed.   Yes Historical Provider, MD  doxepin (SINEQUAN) 100 MG capsule Take 100 mg by mouth at bedtime.   Yes Historical Provider, MD  doxepin (SINEQUAN) 25 MG capsule Take 25 mg by mouth 2 (two) times daily.   Yes Historical Provider, MD  HYDROcodone-acetaminophen (NORCO/VICODIN) 5-325 MG per tablet Take 1 tablet by mouth every 6 (six) hours as needed for moderate pain.   Yes Historical Provider, MD  mometasone (  NASONEX) 50 MCG/ACT nasal spray Place 2 sprays into the nose daily.   Yes Historical Provider, MD  nadolol (CORGARD) 40 MG tablet 1 tablet in the morning, one half tablet in the evening 06/08/12  Yes Kathrynn Ducking, MD  nitroGLYCERIN (NITROSTAT) 0.4 MG SL tablet Place 0.4 mg under the tongue every 5 (five) minutes as needed for chest pain.   Yes Historical Provider, MD  omeprazole (PRILOSEC OTC) 20 MG tablet Take 20 mg by mouth daily.   Yes Historical Provider, MD  rosuvastatin (CRESTOR) 10 MG tablet Take 10 mg by mouth daily.   Yes Historical Provider, MD  tiZANidine (ZANAFLEX) 4 MG tablet Take 4 mg by mouth every 8 (eight) hours as needed for muscle spasms.   Yes Historical Provider, MD    ROS:  Out of a complete 14 system review of symptoms, the patient complains only of the following symptoms,  and all other reviewed systems are negative.  Joint pain, back pain Headache  Blood pressure 114/64, pulse 64, height 4\' 9"  (1.448 m), weight 137 lb 8 oz (62.37 kg).  Physical Exam  General: The patient is alert and cooperative at the time of the examination.  Skin: No significant peripheral edema is noted.   Neurologic Exam  Mental status: The patient is alert and oriented x 3 at the time of the examination. The patient has apparent normal recent and remote memory, with an apparently normal attention span and concentration ability.   Cranial nerves: Facial symmetry is present. Speech is normal, no aphasia or dysarthria is noted. Extraocular movements are full. Visual fields are full.  Motor: The patient has good strength in all 4 extremities, with exception of a mild right foot drop.  Sensory examination: Soft touch sensation is symmetric on the face, arms, and legs. There is no definite stocking pattern pinprick sensory deficit in the legs. Pinprick sensation is symmetric in arms and face. The patient has decreased position sense on the right foot, present on the left.  Coordination: The patient has good finger-nose-finger and heel-to-shin bilaterally.  Gait and station: The patient has the ability to walk without assistance. The patient leans to the right. Tandem gait is unsteady. Romberg is positive, the patient goes backwards. No drift is seen.  Reflexes: Deep tendon reflexes are symmetric.   Assessment/Plan:  1. Gait disturbance  2. Chronic daily headache  The patient is having new issues with her walking. She will be sent for blood work today, she will be sent for physical therapy for gait training. She will follow-up in 6 months, sooner if needed. She will continue the fentanyl if needed.  Kristin Alexanders MD 07/10/2015 7:50 PM  [facility] [address] [place], [place] [address]  Phone [phone] Fax [phone]

## 2015-07-10 NOTE — Patient Instructions (Signed)
Fall Prevention in the Home  Falls can cause injuries and can affect people from all age groups. There are many simple things that you can do to make your home safe and to help prevent falls. WHAT CAN I DO ON THE OUTSIDE OF MY HOME?  Regularly repair the edges of walkways and driveways and fix any cracks.  Remove high doorway thresholds.  Trim any shrubbery on the main path into your home.  Use bright outdoor lighting.  Clear walkways of debris and clutter, including tools and rocks.  Regularly check that handrails are securely fastened and in good repair. Both sides of any steps should have handrails.  Install guardrails along the edges of any raised decks or porches.  Have leaves, snow, and ice cleared regularly.  Use sand or salt on walkways during winter months.  In the garage, clean up any spills right away, including grease or oil spills. WHAT CAN I DO IN THE BATHROOM?  Use night lights.  Install grab bars by the toilet and in the tub and shower. Do not use towel bars as grab bars.  Use non-skid mats or decals on the floor of the tub or shower.  If you need to sit down while you are in the shower, use a plastic, non-slip stool..  Keep the floor dry. Immediately clean up any water that spills on the floor.  Remove soap buildup in the tub or shower on a regular basis.  Attach bath mats securely with double-sided non-slip rug tape.  Remove throw rugs and other tripping hazards from the floor. WHAT CAN I DO IN THE BEDROOM?  Use night lights.  Make sure that a bedside light is easy to reach.  Do not use oversized bedding that drapes onto the floor.  Have a firm chair that has side arms to use for getting dressed.  Remove throw rugs and other tripping hazards from the floor. WHAT CAN I DO IN THE KITCHEN?   Clean up any spills right away.  Avoid walking on wet floors.  Place frequently used items in easy-to-reach places.  If you need to reach for something  above you, use a sturdy step stool that has a grab bar.  Keep electrical cables out of the way.  Do not use floor polish or wax that makes floors slippery. If you have to use wax, make sure that it is non-skid floor wax.  Remove throw rugs and other tripping hazards from the floor. WHAT CAN I DO IN THE STAIRWAYS?  Do not leave any items on the stairs.  Make sure that there are handrails on both sides of the stairs. Fix handrails that are broken or loose. Make sure that handrails are as long as the stairways.  Check any carpeting to make sure that it is firmly attached to the stairs. Fix any carpet that is loose or worn.  Avoid having throw rugs at the top or bottom of stairways, or secure the rugs with carpet tape to prevent them from moving.  Make sure that you have a light switch at the top of the stairs and the bottom of the stairs. If you do not have them, have them installed. WHAT ARE SOME OTHER FALL PREVENTION TIPS?  Wear closed-toe shoes that fit well and support your feet. Wear shoes that have rubber soles or low heels.  When you use a stepladder, make sure that it is completely opened and that the sides are firmly locked. Have someone hold the ladder while you   are using it. Do not climb a closed stepladder.  Add color or contrast paint or tape to grab bars and handrails in your home. Place contrasting color strips on the first and last steps.  Use mobility aids as needed, such as canes, walkers, scooters, and crutches.  Turn on lights if it is dark. Replace any light bulbs that burn out.  Set up furniture so that there are clear paths. Keep the furniture in the same spot.  Fix any uneven floor surfaces.  Choose a carpet design that does not hide the edge of steps of a stairway.  Be aware of any and all pets.  Review your medicines with your healthcare provider. Some medicines can cause dizziness or changes in blood pressure, which increase your risk of falling. Talk  with your health care provider about other ways that you can decrease your risk of falls. This may include working with a physical therapist or trainer to improve your strength, balance, and endurance.   This information is not intended to replace advice given to you by your health care provider. Make sure you discuss any questions you have with your health care provider.   Document Released: 02/06/2002 Document Revised: 07/03/2014 Document Reviewed: 03/23/2014 Elsevier Interactive Patient Education 2016 [employer]  

## 2015-07-17 LAB — TSH: TSH: 2.7 u[IU]/mL (ref 0.450–4.500)

## 2015-07-17 LAB — VITAMIN B12: VITAMIN B 12: 333 pg/mL (ref 211–946)

## 2015-07-17 LAB — RPR: RPR Ser Ql: NONREACTIVE

## 2015-07-17 LAB — METHYLMALONIC ACID, SERUM: Methylmalonic Acid: 469 nmol/L — ABNORMAL HIGH (ref 0–378)

## 2015-07-22 DIAGNOSIS — D469 Myelodysplastic syndrome, unspecified: Secondary | ICD-10-CM | POA: Diagnosis not present

## 2015-07-22 DIAGNOSIS — D696 Thrombocytopenia, unspecified: Secondary | ICD-10-CM | POA: Diagnosis not present

## 2015-07-22 DIAGNOSIS — D461 Refractory anemia with ring sideroblasts: Secondary | ICD-10-CM | POA: Diagnosis not present

## 2015-07-23 DIAGNOSIS — D461 Refractory anemia with ring sideroblasts: Secondary | ICD-10-CM | POA: Diagnosis not present

## 2015-08-05 ENCOUNTER — Ambulatory Visit: Payer: Medicare Other | Attending: Neurology | Admitting: Physical Therapy

## 2015-08-05 DIAGNOSIS — R2689 Other abnormalities of gait and mobility: Secondary | ICD-10-CM | POA: Diagnosis not present

## 2015-08-05 DIAGNOSIS — R293 Abnormal posture: Secondary | ICD-10-CM

## 2015-08-05 DIAGNOSIS — H8112 Benign paroxysmal vertigo, left ear: Secondary | ICD-10-CM | POA: Insufficient documentation

## 2015-08-05 DIAGNOSIS — R2681 Unsteadiness on feet: Secondary | ICD-10-CM

## 2015-08-05 DIAGNOSIS — M6281 Muscle weakness (generalized): Secondary | ICD-10-CM | POA: Diagnosis not present

## 2015-08-05 NOTE — Therapy (Signed)
[place] [address] [place], [place], [address] Phone: [phone]   Fax:  [phone]  Physical Therapy Evaluation  Patient Details  Name: Kristin Mendez MRN: [id number] Date of Birth: Sep 17, 1940 Referring Provider: Margette Fast, MD  Encounter Date: 08/05/2015      PT End of Session - 08/05/15 1238    Visit Number 1   Number of Visits 9  eval + 8 visits   Date for PT Re-Evaluation 10/04/15   Authorization Type [employer] Medicare    Authorization Time Period G Codes required   PT Start Time 1100   PT Stop Time 1150   PT Time Calculation (min) 50 min   Equipment Utilized During Treatment Gait belt   Activity Tolerance Patient tolerated treatment well   Behavior During Therapy [facility] for tasks assessed/performed      Past Medical History  Diagnosis Date  . Headache(784.0)     Chronic daily  . Obesity   . Hypertension   . Myelodysplasia   . Dyslipidemia   . Occipital neuralgia     Right  . Peptic ulcer disease   . Ankle fracture, left   . Arthritis   . Cervical spondylosis without myelopathy 02/08/2013  . Vitamin D deficiency   . Abnormality of gait 07/10/2015    Past Surgical History  Procedure Laterality Date  . Lumbar spine surgery      5 on occasions  . Abdominal hysterectomy    . Dilation and curettage of uterus    . Tubal ligation Bilateral   . Breast lumpectomy    . Tonsillectomy    . Appendectomy    . Ankle fracture surgery Left   . Rotator cuff repair Left   . Cataract extraction, bilateral    . Lumbar laminectomy      Upper pending  . Thoracic laminectomy  9/13    There were no vitals filed for this visit.       Subjective Assessment - 08/05/15 1104    Subjective I've been having a lot of falls, and Dr. Jannifer Franklin is afraid I'm going to fall and break a hip or something. Last fall occurred approx. 1 month ago. Pt has sustained numerous falls in the past 6 months. Pt has 2 sets of stair  lifts in home but not on stairs to enter through garage (9 stairs with B rails but unable to reach both rails). Pt uses cane, rolling walker, and a wheelchair at times depending on pt-perceived instability.   Pertinent History PMH significant for: chronic daily headache, HTN, HLD, occipital neuralgia, L ankle fx, vitamin D deficiency, myelodysplasia (receives injections every 2-3 months), R foot drop, scoliosis, Lumbar surgery x5, thoracic and lumbar laminectomies, B cataract extraction.    Patient Stated Goals "To stop falling so much."   Currently in Pain? Yes   Pain Score 8    Pain Location Head   Pain Orientation Right;Other (Comment);Anterior  R frontal (area of eye)   Pain Descriptors / Indicators Headache   Pain Type Chronic pain   Pain Radiating Towards does not radiate   Pain Onset More than a month ago   Pain Frequency Constant   Aggravating Factors  bright lighting, loud noises, strong smells   Pain Relieving Factors medication   Effect of Pain on Daily Activities limits standing and walking tolerance   Multiple Pain Sites Yes   Pain Score 7   Pain Location Back   Pain Orientation Right;Left;Lower   Pain Descriptors /  Indicators Aching   Pain Type Chronic pain   Pain Onset More than a month ago   Pain Frequency Constant   Aggravating Factors  rainy weather   Pain Relieving Factors analgesic spray, lidocain patches   Effect of Pain on Daily Activities limits standing and walking tolerance            [facility] PT Assessment - 08/05/15 0001    Assessment   Medical Diagnosis Cervical spondylosis without myelopathy; Intractable migraine without aura and without status migrainosus; abnormality of gait   Referring Provider Margette Fast, MD   Onset Date/Surgical Date 03/02/01   Hand Dominance Right   Precautions   Precautions Fall   Restrictions   Weight Bearing Restrictions No   Balance Screen   Has the patient fallen in the past 6 months Yes   How many times? 5-10  times   Has the patient had a decrease in activity level because of a fear of falling?  Yes   Is the patient reluctant to leave their home because of a fear of falling?  No   Home Environment   Living Environment Private residence   Living Arrangements Spouse/significant other   Type of [place] to enter   Entrance Stairs-Number of Steps 9   Entrance Stairs-Rails Cannot reach both   [place]   Alternate Level Stairs-Number of Steps --  has stair lifts x2 for other sets of stairs in home   [place] - standard;Walker - 2 wheels;Cane - single point;Wheelchair - manual;Hospital bed;Shower seat - built in   Additional Comments no stair lift on 9 stairs to enter home    Prior Function   Level of Independence Independent   Vocation Retired   Leisure Likes to Risk manager   Overall Cognitive Status Within Functional Limits for tasks assessed   Sensation   Light Touch Impaired Detail   Light Touch Impaired Details Impaired RLE   Proprioception Impaired Detail   Proprioception Impaired Details Impaired RUE   Additional Comments Chronic numbness on lateral aspect of R lower leg, R toes 4 and 5.   Coordination   Gross Motor Movements are Fluid and Coordinated Yes   Heel Shin Test Quality of movement slightly impaired in RLE as compared with LLE.   Posture/Postural Control   Posture/Postural Control Postural limitations   Postural Limitations Rounded Shoulders;Forward head;Increased thoracic kyphosis;Posterior pelvic tilt;Weight shift right   Posture Comments In standing, noted  R lateral trunk shift, thoracolumbar flexion. Head/trunk position grossly midline in seated.   ROM / Strength   AROM / PROM / Strength Strength;AROM;PROM   AROM   Overall AROM  Deficits   Overall AROM Comments R hip extension limited to grossly 5 degrees; R ankle DF limited to grossly 5 degrees.   PROM   Overall PROM  Deficits   Overall PROM  Comments R hip extension and R ankle DF limited by soft tissue restriction in hip flexors and ankle plantarflexors, respectively.   Strength   Overall Strength Deficits   Overall Strength Comments B hip flexion 4/5, R  ankle DF 4+/5 within available ROM. hip ABD 3-/5 on R (and painful due to limited hip ext AROM), L hip abduction 4-/5. Hip extension 4-/5  on R (within available ROM) and 4/5 on L.   Transfers   Transfers Sit to Stand;Stand to Sit   Sit to Stand 6: Modified independent (Device/Increase time)   Stand  to Sit 6: Modified independent (Device/Increase time)   Transfer Cueing Able to consistently stand from standard chair without UE use.   Ambulation/Gait   Gait Pattern Step-through pattern;Decreased step length - right;Decreased stance time - left;Decreased dorsiflexion - right;Right circumduction;Lateral trunk lean to right;Trunk flexed;Poor foot clearance - left;Poor foot clearance - right  limited R ankle eversion during RLE advancement   Ambulation Surface Level;Indoor   Gait velocity 2.42 ft/sec  < / = 2.62 ft/sec = limited communty ambulator                   [facility] Adult PT Treatment/Exercise - 08/05/15 0001    Ambulation/Gait   Ambulation/Gait Yes   Ambulation/Gait Assistance 5: Supervision;4: Min guard   Ambulation/Gait Assistance Details Required min guard due to single L toe catch during linear gait, mild gait instability with 180-degree turns during gait.   Ambulation Distance (Feet) 325 Feet   Assistive device None   Standardized Balance Assessment   Standardized Balance Assessment Berg Balance Test   Berg Balance Test   Sit to Stand Able to stand without using hands and stabilize independently   Standing Unsupported Able to stand safely 2 minutes   Sitting with Back Unsupported but Feet Supported on Floor or Stool Able to sit safely and securely 2 minutes   Stand to Sit Sits safely with minimal use of hands   Transfers Able to transfer safely, minor  use of hands   Standing Unsupported with Eyes Closed Able to stand 10 seconds with supervision   Standing Ubsupported with Feet Together Able to place feet together independently but unable to hold for 30 seconds   From Standing, Reach Forward with Outstretched Arm Can reach forward >12 cm safely (5")  9"   From Standing Position, Pick up Object from Floor Unable to try/needs assist to keep balance  HHA required   From Standing Position, Turn to Look Behind Over each Shoulder Looks behind one side only/other side shows less weight shift  wt shift to L > R   Turn 360 Degrees Able to turn 360 degrees safely but slowly   Standing Unsupported, Alternately Place Feet on Step/Stool Able to complete 4 steps without aid or supervision   Standing Unsupported, One Foot in Front Able to plae foot ahead of the other independently and hold 30 seconds   Standing on One Leg Tries to lift leg/unable to hold 3 seconds but remains standing independently   Total Score 39                  PT Short Term Goals - 08/05/15 1230    PT SHORT TERM GOAL #1   Title Pt will independently perform HEP to maximize functional gains made in PT.   (Target date = 09/02/15)   PT SHORT TERM GOAL #2   Title Pt will improve Berg score from 39 to >/= 42/56 to indicate progress toward decreased fall risk.    PT SHORT TERM GOAL #3   Title Pt will improve gait velocity rom 2.42 ft/sec to > 2.62 ft/sec to indicate status of community ambulator.    PT SHORT TERM GOAL #4   Title Pt will negotiate 9 stairs with single rail with mod I to indicate increased safety using primary home entrance.    PT SHORT TERM GOAL #5   Title Pt will ambulate >250' over level, indoor surfaces with mod I using LRAD, negotiating obstacles with no overt LOB to indicate increased safety with household  mobility.            PT Long Term Goals - 08/05/15 1227    PT LONG TERM GOAL #1   Title Pt will improve Berg score from 39/56 to >/= 45/56 to  indicate decreased fall risk.  (Target date: 09/30/15)   PT LONG TERM GOAL #2   Title Pt will negotiate 9 stairs with single rail while carrying simulated grocery bag with mod I, no overt LOB to indicate increased safety carrying groceries into home.    PT LONG TERM GOAL #3   Title Pt will transfer from supine on floor to standing using standard chair with mod I, increased time to indicate increased safety/independence with fall recovery.    PT LONG TERM GOAL #4   Title Pt will ambulate 500' over unlevel, paved surfaces with mod I using LRAD to indicate increased safety with community mobility.    PT LONG TERM GOAL #5   Title Pt will negotiate standard ramp and curb step with mod I to indicate increased safety traversing community obstacles.    Additional Long Term Goals   Additional Long Term Goals Yes   PT LONG TERM GOAL #6   Title ABC score will improve from 58.1% to >/= 71% to indicate significant increase in balance confidence.                Plan - 08/05/15 1239    Clinical Impression Statement Pt is a 75 y/o F referred to outpatient PT to address gait instability and frequent falls. PMH significant for: chronic daily headache, HTN, HLD, occipital neuralgia, L ankle fx, vitamin D deficiency, myelodysplasia (receives injections every 2-3 months), R foot drop, scoliosis, Lumbar surgery x5, thoracic and lumbar laminectomies, B cataract extraction. PT evaluation reveals the following impairments: Berg score suggestive of significant fall risk; gait velocity indicative of pt status of limited community ambulator; decreased LE flexbility and strength; postural and gait impairments; and decreased balance confidence. Pt will benefit from skilled outpatient PT 1x/week for 8 weeks to address said impairments.      Rehab Potential Good   Clinical Impairments Affecting Rehab Potential Positive: pt motivation and compliance.  Negative: financial limitations (insurance co-pay), multiple  co-morbidities.   PT Frequency 1x / week   PT Duration 8 weeks   PT Treatment/Interventions ADLs/Self Care Home Management;Gait training;Stair training;DME Instruction;Vestibular;Functional mobility training;Neuromuscular re-education;Therapeutic exercise;Therapeutic activities;Patient/family education;Orthotic Fit/Training;Balance training;Manual techniques;Passive range of motion   PT Next Visit Plan Initiaite HEP. TE: stretch plantarflexors, strengthen dorsiflexors (focus on R); strengthen B hips. Postural alignment in standing. NMR: standing balance with increased vestib reliance.; single limb stance: progress from static > dynamic (emphasize weight shift to L).    Consulted and Agree with Plan of Care Patient      Patient will benefit from skilled therapeutic intervention in order to improve the following deficits and impairments:  Abnormal gait, Decreased balance, Decreased activity tolerance, Decreased coordination, Decreased strength, Impaired sensation, Pain, Postural dysfunction, Impaired tone, Increased muscle spasms, Impaired perceived functional ability, Impaired UE functional use, Impaired flexibility, Other (comment) (Pain will be monitored but not directly addressed in PT due to nature of referral. )  Visit Diagnosis: Unsteadiness on feet - Plan: PT plan of care cert/re-cert  Other abnormalities of gait and mobility - Plan: PT plan of care cert/re-cert  Abnormal posture - Plan: PT plan of care cert/re-cert  Muscle weakness (generalized) - Plan: PT plan of care cert/re-cert      G-Codes - [id number]  Functional Assessment Tool Used Berg = [id number]   Functional Limitation Mobility: Walking and moving around   Mobility: Walking and Moving Around Current Status [phone]) At least 20 percent but less than 40 percent impaired, limited or restricted   Mobility: Walking and Moving Around Goal Status [phone]) At least 1 percent but less than 20 percent impaired, limited or restricted        Problem List Patient Active Problem List   Diagnosis Date Noted  . Abnormality of gait 07/10/2015  . Cervical spondylosis without myelopathy 02/08/2013  . Neuralgia, neuritis, and radiculitis, unspecified 10/22/2011  . Intractable migraine without aura 10/22/2011    Billie Ruddy, PT, DPT [facility] [address] [place], [place], [address] Phone: [phone]   Fax:  [phone] 08/05/2015, 1:01 PM  Name: Kristin Mendez MRN: [id number] Date of Birth: 13-Dec-1940

## 2015-08-06 DIAGNOSIS — K589 Irritable bowel syndrome without diarrhea: Secondary | ICD-10-CM | POA: Diagnosis not present

## 2015-08-12 ENCOUNTER — Ambulatory Visit: Payer: Medicare Other | Admitting: Physical Therapy

## 2015-08-12 DIAGNOSIS — R2689 Other abnormalities of gait and mobility: Secondary | ICD-10-CM

## 2015-08-12 DIAGNOSIS — R293 Abnormal posture: Secondary | ICD-10-CM | POA: Diagnosis not present

## 2015-08-12 DIAGNOSIS — M6281 Muscle weakness (generalized): Secondary | ICD-10-CM | POA: Diagnosis not present

## 2015-08-12 DIAGNOSIS — R2681 Unsteadiness on feet: Secondary | ICD-10-CM | POA: Diagnosis not present

## 2015-08-12 DIAGNOSIS — H8112 Benign paroxysmal vertigo, left ear: Secondary | ICD-10-CM | POA: Diagnosis not present

## 2015-08-12 NOTE — Patient Instructions (Addendum)
Achilles / Gastroc, Standing    Stand with both hands on stable surface (countertop or wall) with right foot behind, heel on floor and turned slightly out, leg straight, front leg bent. Move hips forward. You should feel a gentle stretch in the calf of the back leg. Hold __45_ seconds. Switch to other leg, holding for 45 seconds. Repeat _3__ times per day on each leg.   Toe / Heel Raise (Standing)    Stand with both hands on countertop: raise heels, then rock back on heels and raise toes. Repeat __8__ times. Perform this exercise __1-2__ times per day.   Abduction: Clam (Eccentric) - Side-Lying    Lie on side with knees bent. Lift top knee, keeping feet together. Hold onto edge of bed with hand of top arm to prevent trunk from moving.  Keep trunk steady. Slowly lower for 3-5 seconds. You should feel this in your "back pocket" muscle.  _12__ reps per set, _2_ sets per day on each leg.  Balance: Eyes Closed - Bilateral (Varied Surfaces)    Stand with your back to a corner with a stable chair in front of you. Stand on 1 pillow with feet shoulder width apart, close eyes. Maintain balance __30__ seconds. Repeat __4__ times per set. Do __1-2__ sets per per day.

## 2015-08-12 NOTE — Therapy (Signed)
[place] [address] [place], [place], [address] Phone: [phone]   Fax:  [phone]  Physical Therapy Treatment  Patient Details  Name: Kristin Mendez MRN: [id number] Date of Birth: October 27, 1940 Referring Provider: Margette Fast, MD  Encounter Date: 08/12/2015      PT End of Session - 08/12/15 1737    Visit Number 2   Number of Visits 9   Date for PT Re-Evaluation 10/04/15   Authorization Type [employer] Medicare    Authorization Time Period G Codes required   PT Start Time 1401   PT Stop Time 1440   PT Time Calculation (min) 39 min   Activity Tolerance Patient limited by fatigue  Frequent seated rest breaks required due to BLE fatigue   Behavior During Therapy [facility] for tasks assessed/performed      Past Medical History  Diagnosis Date  . Headache(784.0)     Chronic daily  . Obesity   . Hypertension   . Myelodysplasia   . Dyslipidemia   . Occipital neuralgia     Right  . Peptic ulcer disease   . Ankle fracture, left   . Arthritis   . Cervical spondylosis without myelopathy 02/08/2013  . Vitamin D deficiency   . Abnormality of gait 07/10/2015    Past Surgical History  Procedure Laterality Date  . Lumbar spine surgery      5 on occasions  . Abdominal hysterectomy    . Dilation and curettage of uterus    . Tubal ligation Bilateral   . Breast lumpectomy    . Tonsillectomy    . Appendectomy    . Ankle fracture surgery Left   . Rotator cuff repair Left   . Cataract extraction, bilateral    . Lumbar laminectomy      Upper pending  . Thoracic laminectomy  9/13    There were no vitals filed for this visit.      Subjective Assessment - 08/12/15 1406    Subjective "I didn't get a lot of sleep last night..my back was bothering me more than my head was."   Pertinent History PMH significant for: chronic daily headache, HTN, HLD, occipital neuralgia, L ankle fx, vitamin D deficiency, myelodysplasia  (receives injections every 2-3 months), R foot drop, scoliosis, Lumbar surgery x5, thoracic and lumbar laminectomies, B cataract extraction.    Patient Stated Goals "To stop falling so much."   Currently in Pain? Yes   Pain Score 6    Pain Orientation Right;Other (Comment)  R frontal   Pain Descriptors / Indicators Headache   Pain Type Chronic pain   Pain Onset More than a month ago   Pain Frequency Constant   Aggravating Factors  bright lighting, loud noises, strong smells   Pain Relieving Factors medication   Multiple Pain Sites Yes   Pain Score 6   Pain Location Back   Pain Orientation Right;Left;Lower   Pain Descriptors / Indicators Aching   Pain Type Chronic pain   Pain Onset More than a month ago   Pain Frequency Constant   Aggravating Factors  rainy weather   Pain Relieving Factors analgesic spray, lidocaine patches   Effect of Pain on Daily Activities limits standing and walking tolerance                         [facility] Adult PT Treatment/Exercise - 08/12/15 0001    Exercises   Exercises Other Exercises   Other Exercises  B gastrocnemius self-stretch 2 x45-sec holds per side. Standing with BUE support, B ankle PF/DF x8 reps (to pt fatigue). Supine, hook lying with resepctive LE extended off EOM, pt performed B iliopsoas, B rectus femoris self-stretch 2 x45-sec holds. Clamshells for hip ABD strengthening x12 reps per side (to pt fatigue)             Balance Exercises - 08/12/15 1736    Balance Exercises: Standing   Standing Eyes Closed Wide (BOA);Foam/compliant surface;2 reps;30 secs  1 pillow           PT Education - 08/12/15 1736    Education provided Yes   Education Details HEP initiated; see Pt Instructions.   Person(s) Educated Patient   Methods Explanation;Demonstration;Tactile cues;Verbal cues;Handout   Comprehension Verbalized understanding;Returned demonstration          PT Short Term Goals - 08/05/15 1230    PT SHORT TERM GOAL  #1   Title Pt will independently perform HEP to maximize functional gains made in PT.   (Target date = 09/02/15)   PT SHORT TERM GOAL #2   Title Pt will improve Berg score from 39 to >/= 42/56 to indicate progress toward decreased fall risk.    PT SHORT TERM GOAL #3   Title Pt will improve gait velocity rom 2.42 ft/sec to > 2.62 ft/sec to indicate status of community ambulator.    PT SHORT TERM GOAL #4   Title Pt will negotiate 9 stairs with single rail with mod I to indicate increased safety using primary home entrance.    PT SHORT TERM GOAL #5   Title Pt will ambulate >250' over level, indoor surfaces with mod I using LRAD, negotiating obstacles with no overt LOB to indicate increased safety with household mobility.            PT Long Term Goals - 08/05/15 1227    PT LONG TERM GOAL #1   Title Pt will improve Berg score from 39/56 to >/= 45/56 to indicate decreased fall risk.  (Target date: 09/30/15)   PT LONG TERM GOAL #2   Title Pt will negotiate 9 stairs with single rail while carrying simulated grocery bag with mod I, no overt LOB to indicate increased safety carrying groceries into home.    PT LONG TERM GOAL #3   Title Pt will transfer from supine on floor to standing using standard chair with mod I, increased time to indicate increased safety/independence with fall recovery.    PT LONG TERM GOAL #4   Title Pt will ambulate 500' over unlevel, paved surfaces with mod I using LRAD to indicate increased safety with community mobility.    PT LONG TERM GOAL #5   Title Pt will negotiate standard ramp and curb step with mod I to indicate increased safety traversing community obstacles.    Additional Long Term Goals   Additional Long Term Goals Yes   PT LONG TERM GOAL #6   Title ABC score will improve from 58.1% to >/= 71% to indicate significant increase in balance confidence.                Plan - 08/12/15 1738    Clinical Impression Statement Session focused on initiating HEP  with emphasis on LE strengthening. Pt tolerated interventions well, but did required multiple seated rest breaks due to LE fatigue.    Rehab Potential Good   Clinical Impairments Affecting Rehab Potential Positive: pt motivation and compliance.  Negative: financial limitations (insurance co-pay), multiple  co-morbidities.   PT Frequency 1x / week   PT Duration 8 weeks   PT Treatment/Interventions ADLs/Self Care Home Management;Gait training;Stair training;DME Instruction;Vestibular;Functional mobility training;Neuromuscular re-education;Therapeutic exercise;Therapeutic activities;Patient/family education;Orthotic Fit/Training;Balance training;Manual techniques;Passive range of motion   PT Next Visit Plan Address postural alignment in standing. NMR: standing balance with increased vestib reliance.; single limb stance: progress from static > dynamic (emphasize weight shift to L). TE: strengthen B hips.   Consulted and Agree with Plan of Care Patient      Patient will benefit from skilled therapeutic intervention in order to improve the following deficits and impairments:  Abnormal gait, Decreased balance, Decreased activity tolerance, Decreased coordination, Decreased strength, Impaired sensation, Pain, Postural dysfunction, Impaired tone, Increased muscle spasms, Impaired perceived functional ability, Impaired UE functional use, Impaired flexibility, Other (comment) (Pain will be monitored but not directly addressed in PT due to nature of referral. )  Visit Diagnosis: Other abnormalities of gait and mobility  Muscle weakness (generalized)  Unsteadiness on feet     Problem List Patient Active Problem List   Diagnosis Date Noted  . Abnormality of gait 07/10/2015  . Cervical spondylosis without myelopathy 02/08/2013  . Neuralgia, neuritis, and radiculitis, unspecified 10/22/2011  . Intractable migraine without aura 10/22/2011   Billie Ruddy, PT, DPT [facility] [address] [place], [place], [address] Phone: [phone]   Fax:  [phone] 08/12/2015, 5:40 PM  Name: Kristin Mendez MRN: [id number] Date of Birth: April 24, 1940

## 2015-08-19 ENCOUNTER — Ambulatory Visit: Payer: Medicare Other | Admitting: Physical Therapy

## 2015-08-19 DIAGNOSIS — R2681 Unsteadiness on feet: Secondary | ICD-10-CM | POA: Diagnosis not present

## 2015-08-19 DIAGNOSIS — R293 Abnormal posture: Secondary | ICD-10-CM | POA: Diagnosis not present

## 2015-08-19 DIAGNOSIS — M6281 Muscle weakness (generalized): Secondary | ICD-10-CM | POA: Diagnosis not present

## 2015-08-19 DIAGNOSIS — R2689 Other abnormalities of gait and mobility: Secondary | ICD-10-CM | POA: Diagnosis not present

## 2015-08-19 DIAGNOSIS — H8112 Benign paroxysmal vertigo, left ear: Secondary | ICD-10-CM | POA: Diagnosis not present

## 2015-08-19 NOTE — Therapy (Signed)
[facility] [address] [place], [place], [address] Phone: [phone]   Fax:  [phone]  Physical Therapy Treatment  Patient Details  Name: Kristin Mendez MRN: [id number] Date of Birth: March 20, 1940 Referring Provider: Margette Fast, MD  Encounter Date: 08/19/2015      PT End of Session - 08/19/15 0949    Visit Number 3   Number of Visits 9   Date for PT Re-Evaluation 10/04/15   Authorization Type [employer] Medicare    Authorization Time Period G Codes required   PT Start Time 0933   PT Stop Time 1015   PT Time Calculation (min) 42 min   Activity Tolerance Patient limited by fatigue  Frequent seated rest breaks required due to BLE fatigue   Behavior During Therapy [facility] for tasks assessed/performed      Past Medical History  Diagnosis Date  . Headache(784.0)     Chronic daily  . Obesity   . Hypertension   . Myelodysplasia   . Dyslipidemia   . Occipital neuralgia     Right  . Peptic ulcer disease   . Ankle fracture, left   . Arthritis   . Cervical spondylosis without myelopathy 02/08/2013  . Vitamin D deficiency   . Abnormality of gait 07/10/2015    Past Surgical History  Procedure Laterality Date  . Lumbar spine surgery      5 on occasions  . Abdominal hysterectomy    . Dilation and curettage of uterus    . Tubal ligation Bilateral   . Breast lumpectomy    . Tonsillectomy    . Appendectomy    . Ankle fracture surgery Left   . Rotator cuff repair Left   . Cataract extraction, bilateral    . Lumbar laminectomy      Upper pending  . Thoracic laminectomy  9/13    There were no vitals filed for this visit.      Subjective Assessment - 08/19/15 0936    Subjective "I've only fallen once since I last saw you. My purse was behind me, so I turned to reach for it. The next thing I knew I was on the floor." Pt was uninjured and denies head trauma.   Pertinent History PMH significant for: chronic daily  headache, HTN, HLD, occipital neuralgia, L ankle fx, vitamin D deficiency, myelodysplasia (receives injections every 2-3 months), R foot drop, scoliosis, Lumbar surgery x5, thoracic and lumbar laminectomies, B cataract extraction.    Patient Stated Goals "To stop falling so much."   Currently in Pain? Yes   Pain Score 7    Pain Location Head   Pain Orientation Right;Other (Comment)  R frontal    Pain Descriptors / Indicators Headache   Pain Type Chronic pain   Pain Radiating Towards does not radiate   Pain Onset More than a month ago   Pain Frequency Constant   Aggravating Factors  bright lighting, loud noises, strong smells   Pain Relieving Factors medication   Multiple Pain Sites Yes   Pain Score 5   Pain Location Back   Pain Orientation Right;Left;Lower   Pain Descriptors / Indicators Aching   Pain Type Chronic pain   Pain Onset More than a month ago   Aggravating Factors  rainy weather   Pain Relieving Factors analgesic spray; lidocaine patches                         [facility] Adult PT Treatment/Exercise -  08/19/15 0001    Ambulation/Gait   Ambulation/Gait Yes   Ambulation/Gait Assistance 5: Supervision   Ambulation/Gait Assistance Details Cueing for use of SPC in R hand to promote more normalized postural alignment (decrease lateral shift to R side). Trialed SPC with rubber quad tip for gait trial x100'; however, neither pt nor PT noted improvement in gait pattern.   Ambulation Distance (Feet) 350 Feet   Assistive device Straight cane   Gait Pattern Step-through pattern;Decreased step length - right;Decreased stance time - left;Decreased dorsiflexion - right;Right circumduction;Lateral trunk lean to right;Trunk flexed;Poor foot clearance - left;Poor foot clearance - right  limited R ankle eversion during RLE advancement   Ambulation Surface Level;Indoor   Curb 5: Supervision   Curb Details (indicate cue type and reason) x5 reps with SPC, attempting different  sequencing/technique to determine safest/most stable technique. Pt most stable ascending leading with LLE, then SPC, then RLE and descending with SPC, RLE. then LLE.    Exercises   Exercises Other Exercises   Other Exercises  Reviewed all home exercises provided during previous session (see Pt Instructions) performing B gastroc stretxh x2 trials per side, clamshells 1 x15 reps per side; heel/toe raises x12 reps. Pt independently performed HEP without cueing from this PT. Reps increased to pt tolerance.              Balance Exercises - 08/19/15 1133    Balance Exercises: Standing   Standing Eyes Opened Wide (BOA);Foam/compliant surface;5 reps;Other (comment)  1 pillow; vertical/horizontal head turns x5 reps   Standing Eyes Closed Wide (BOA);Foam/compliant surface;30 secs;1 rep  1 pillow           PT Education - 08/19/15 1000    Education provided Yes   Education Details Reviewed/progressed HEP. See Pt Instructions for details. Recommending pt use SPC for mobility. Reviewed sequencing of SPC on curb step.   Person(s) Educated Patient   Methods Explanation;Demonstration;Verbal cues;Handout   Comprehension Verbalized understanding;Returned demonstration          PT Short Term Goals - 08/19/15 0959    PT SHORT TERM GOAL #1   Title Pt will independently perform HEP to maximize functional gains made in PT.   (Target date = 09/02/15)   Baseline Met 6/19.   Status Achieved   PT SHORT TERM GOAL #2   Title Pt will improve Berg score from 39 to >/= 42/56 to indicate progress toward decreased fall risk.    PT SHORT TERM GOAL #3   Title Pt will improve gait velocity from 2.42 ft/sec to > 2.62 ft/sec to indicate status of community ambulator.    PT SHORT TERM GOAL #4   Title Pt will negotiate 9 stairs with single rail with mod I to indicate increased safety using primary home entrance.    PT SHORT TERM GOAL #5   Title Pt will ambulate >250' over level, indoor surfaces with mod I using  LRAD, negotiating obstacles with no overt LOB to indicate increased safety with household mobility.            PT Long Term Goals - 08/05/15 1227    PT LONG TERM GOAL #1   Title Pt will improve Berg score from 39/56 to >/= 45/56 to indicate decreased fall risk.  (Target date: 09/30/15)   PT LONG TERM GOAL #2   Title Pt will negotiate 9 stairs with single rail while carrying simulated grocery bag with mod I, no overt LOB to indicate increased safety carrying groceries into home.  PT LONG TERM GOAL #3   Title Pt will transfer from supine on floor to standing using standard chair with mod I, increased time to indicate increased safety/independence with fall recovery.    PT LONG TERM GOAL #4   Title Pt will ambulate 500' over unlevel, paved surfaces with mod I using LRAD to indicate increased safety with community mobility.    PT LONG TERM GOAL #5   Title Pt will negotiate standard ramp and curb step with mod I to indicate increased safety traversing community obstacles.    Additional Long Term Goals   Additional Long Term Goals Yes   PT LONG TERM GOAL #6   Title ABC score will improve from 58.1% to >/= 71% to indicate significant increase in balance confidence.                Plan - 08/19/15 0949    Clinical Impression Statement Pt met STG 1 for independent performance of initial HEP. Progressed HEP to pt tolerance and added standing on compliant surface, eyes open with head turns due to pt report of recent fall when turning around to pick up purse. Recommending pt ambulate with SPC (inside home and in community) to decrease fall risk and improve postural alignment. Pt verbally agreed.   Rehab Potential Good   Clinical Impairments Affecting Rehab Potential Positive: pt motivation and compliance.  Negative: financial limitations (insurance co-pay), multiple co-morbidities.   PT Frequency 1x / week   PT Duration 8 weeks   PT Treatment/Interventions ADLs/Self Care Home  Management;Gait training;Stair training;DME Instruction;Vestibular;Functional mobility training;Neuromuscular re-education;Therapeutic exercise;Therapeutic activities;Patient/family education;Orthotic Fit/Training;Balance training;Manual techniques;Passive range of motion   PT Next Visit Plan Ask about pt use of SPC since last session. Continue to address postural alignment in standing. NMR: standing balance with increased vestib reliance.; single limb stance: progress from static > dynamic (emphasize weight shift to L). TE: strengthen B hips.   Consulted and Agree with Plan of Care Patient      Patient will benefit from skilled therapeutic intervention in order to improve the following deficits and impairments:  Decreased balance, Decreased activity tolerance, Decreased coordination, Decreased strength, Impaired sensation, Pain, Postural dysfunction, Impaired tone, Increased muscle spasms, Impaired perceived functional ability, Impaired UE functional use, Impaired flexibility, Other (comment) (Pain will be monitored but not directly addressed in PT due to nature of referral. )  Visit Diagnosis: Other abnormalities of gait and mobility  Muscle weakness (generalized)  Unsteadiness on feet  Abnormal posture     Problem List Patient Active Problem List   Diagnosis Date Noted  . Abnormality of gait 07/10/2015  . Cervical spondylosis without myelopathy 02/08/2013  . Neuralgia, neuritis, and radiculitis, unspecified 10/22/2011  . Intractable migraine without aura 10/22/2011   Billie Ruddy, PT, DPT [facility] [address] [place], [place], [address] Phone: [phone]   Fax:  [phone] 08/19/2015, 11:38 AM  Name: Kristin Mendez MRN: [id number] Date of Birth: 03-Nov-1940

## 2015-08-19 NOTE — Patient Instructions (Addendum)
Achilles / Gastroc, Standing    Stand with both hands on stable surface (countertop or wall) with right foot behind, heel on floor and turned slightly out, leg straight, front leg bent. Move hips forward. You should feel a gentle stretch in the calf of the back leg. Hold __45_ seconds. Switch to other leg, holding for 45 seconds. Repeat _3__ times per day on each leg.   Toe / Heel Raise (Standing)    Stand with both hands on countertop: raise heels, then rock back on heels and raise toes. Hold for 1-2 seconds. Repeat __10__ times. Perform this exercise __1-2__ times per day.   Abduction: Clam (Eccentric) - Side-Lying    Lie on side with knees bent. Lift top knee, keeping feet together. Hold onto edge of bed with hand of top arm to prevent trunk from moving. Keep trunk steady. Slowly lower for 3-5 seconds. You should feel this in your "back pocket" muscle. _15__ reps per set, _2_ sets per day on each leg.  Balance: Eyes Closed - Bilateral (Varied Surfaces)    Stand with your back to a corner with a stable chair in front of you.   1. Stand on 1 pillow with feet shoulder width apart, close eyes. Maintain balance __30__ seconds. Repeat __2__ times per set. Do __1-2__ sets per per day.  2. While still standing on 1 pillow with feet shoulder-width apart, open eyes and turn head/body slowly: from right to left 5 times; up and down 5 times.  Repeat __2__ times per set. Do __1-2__ sets per per day.

## 2015-08-21 DIAGNOSIS — M5432 Sciatica, left side: Secondary | ICD-10-CM | POA: Diagnosis not present

## 2015-08-21 DIAGNOSIS — M545 Low back pain: Secondary | ICD-10-CM | POA: Diagnosis not present

## 2015-08-21 DIAGNOSIS — M5431 Sciatica, right side: Secondary | ICD-10-CM | POA: Diagnosis not present

## 2015-08-26 ENCOUNTER — Ambulatory Visit: Payer: Medicare Other | Admitting: Physical Therapy

## 2015-08-26 DIAGNOSIS — M6281 Muscle weakness (generalized): Secondary | ICD-10-CM

## 2015-08-26 DIAGNOSIS — R2689 Other abnormalities of gait and mobility: Secondary | ICD-10-CM | POA: Diagnosis not present

## 2015-08-26 DIAGNOSIS — R293 Abnormal posture: Secondary | ICD-10-CM

## 2015-08-26 DIAGNOSIS — R2681 Unsteadiness on feet: Secondary | ICD-10-CM | POA: Diagnosis not present

## 2015-08-26 DIAGNOSIS — H8112 Benign paroxysmal vertigo, left ear: Secondary | ICD-10-CM

## 2015-08-26 NOTE — Therapy (Signed)
[facility] [address] [place], [place], [address] Phone: [phone]   Fax:  [phone]  Physical Therapy Treatment  Patient Details  Name: Kristin Mendez MRN: [id number] Date of Birth: 08/16/1940 Referring Provider: Margette Fast, MD  Encounter Date: 08/26/2015      PT End of Session - 08/26/15 1344    Visit Number 4   Number of Visits 12  requesting 3 additional sessions   Date for PT Re-Evaluation 10/25/15   Authorization Type [employer] Medicare    Authorization Time Period G Codes required   PT Start Time 0930   PT Stop Time 1019   PT Time Calculation (min) 49 min   Activity Tolerance Other (comment)  Pt limited by nausea   Behavior During Therapy [facility] for tasks assessed/performed      Past Medical History  Diagnosis Date  . Headache(784.0)     Chronic daily  . Obesity   . Hypertension   . Myelodysplasia   . Dyslipidemia   . Occipital neuralgia     Right  . Peptic ulcer disease   . Ankle fracture, left   . Arthritis   . Cervical spondylosis without myelopathy 02/08/2013  . Vitamin D deficiency   . Abnormality of gait 07/10/2015    Past Surgical History  Procedure Laterality Date  . Lumbar spine surgery      5 on occasions  . Abdominal hysterectomy    . Dilation and curettage of uterus    . Tubal ligation Bilateral   . Breast lumpectomy    . Tonsillectomy    . Appendectomy    . Ankle fracture surgery Left   . Rotator cuff repair Left   . Cataract extraction, bilateral    . Lumbar laminectomy      Upper pending  . Thoracic laminectomy  9/13    There were no vitals filed for this visit.      Subjective Assessment - 08/26/15 0934    Subjective Pt sustained another fall since last session. Again, fall occurred due to pt reaching behind her something. Pt was using her cane and did not perceive dizziness at the time of the fall.  Pt was not injured. Upon further questioning of pt statement  of "having vertigo for a long time," pt reports she has sustained head trauma during 3 falls over past year. Pt reports she continues to experience room-spinning vertigo occaisonally when getting into/out of bed.    Pertinent History PMH significant for: chronic daily headache, HTN, HLD, occipital neuralgia, L ankle fx, vitamin D deficiency, myelodysplasia (receives injections every 2-3 months), R foot drop, scoliosis, Lumbar surgery x5, thoracic and lumbar laminectomies, B cataract extraction.    Patient Stated Goals "To stop falling so much."   Currently in Pain? Yes   Pain Score 5    Pain Location Head   Pain Orientation Right;Other (Comment)  R frontal   Pain Descriptors / Indicators Headache   Pain Type Chronic pain   Pain Onset More than a month ago   Pain Frequency Constant   Aggravating Factors  bright lighting, loud noises, strong smells   Pain Relieving Factors medication   Effect of Pain on Daily Activities limits and walking tolerance   Multiple Pain Sites Yes   Pain Location Back   Pain Orientation Right;Left;Lower   Pain Descriptors / Indicators Aching   Pain Type Chronic pain   Pain Onset More than a month ago   Pain Frequency Constant  Aggravating Factors  rainy weather   Pain Relieving Factors analgesic spray, lidocaine patches   Effect of Pain on Daily Activities limits standing and walking tolerance            [facility] PT Assessment - 08/26/15 0001    Assessment   Medical Diagnosis Cervical spondylosis without myelopathy; Intractable migraine without aura and without status migrainosus; abnormality of gait   Referring Provider Margette Fast, MD   Onset Date/Surgical Date 03/02/01   Prior Function   Level of Independence Independent   Vocation Retired            Vestibular Assessment - 08/26/15 0001    Positional Testing   Dix-Hallpike Dix-Hallpike Left   Sidelying Test Sidelying Right;Sidelying Left   Horizontal Canal Testing Horizontal Canal  Right;Horizontal Canal Left;Horizontal Canal Right Intensity;Horizontal Canal Left Intensity   Dix-Hallpike Left   Dix-Hallpike Left Duration NA   Dix-Hallpike Left Symptoms No nystagmus   Sidelying Left   Sidelying Left Duration 15 seconds   Sidelying Left Symptoms Other (comment);Right nystagmus  difficult to discern if torsional component   Horizontal Canal Right   Horizontal Canal Right Duration Approx. 15 seconds of nystagmus accomanied by vertigo   Horizontal Canal Right Symptoms Ageotrophic;Nystagmus   Horizontal Canal Left   Horizontal Canal Left Duration Able to visualize approx. 10 seconds of ageotropic nystagmus; accompanied by vertigo   Horizontal Canal Left Symptoms Ageotrophic;Nystagmus   Horizontal Canal Right Intensity   Horizontal Canal Right Intensity Moderate   Horizontal Canal Left Intensity   Horizontal Canal Left Intensity Mild                 [facility] Adult PT Treatment/Exercise - 08/26/15 0001    Exercises   Exercises Other Exercises   Other Exercises  Reviewed current HEP and made the following modifications: removed B gastrocnemius stretch due to improved extensbility of B plantarflexors; increased standing ankle PF/DF to 10 reps. Maintained B clamshell reps at 15, bilaterally.         Vestibular Treatment/Exercise - 08/26/15 0001    Vestibular Treatment/Exercise   Vestibular Treatment Provided Canalith Repositioning;Habituation   Canalith Repositioning Comment   Habituation Exercises Horizontal Roll   OTHER   Comment L Gufoni x2 trials. Reassessment of horizontal canals reveals no change in ageotropc nystagmus.   Horizontal Roll   Number of Reps  4   Symptom Description  With PT cueing, pt effectively performed fast rolling x4 reps.            Balance Exercises - 08/26/15 1339    Balance Exercises: Standing   Standing Eyes Closed Wide (BOA);Foam/compliant surface;30 secs;1 rep  1 pillow           PT Education - 08/26/15 1338     Education provided Yes   Education Details Explained nature of BPPV with emphasis on cupulolithaisis. HEP: progressed LE strengthening exercises and added fast rolling for HC habituation.    Person(s) Educated Patient   Methods Explanation;Demonstration;Handout;Verbal cues   Comprehension Returned demonstration;Verbalized understanding          PT Short Term Goals - 08/19/15 0959    PT SHORT TERM GOAL #1   Title Pt will independently perform HEP to maximize functional gains made in PT.   (Target date = 09/02/15)   Baseline Met 6/19.   Status Achieved   PT SHORT TERM GOAL #2   Title Pt will improve Berg score from 39 to >/= 42/56 to indicate progress toward decreased fall risk.  PT SHORT TERM GOAL #3   Title Pt will improve gait velocity from 2.42 ft/sec to > 2.62 ft/sec to indicate status of community ambulator.    PT SHORT TERM GOAL #4   Title Pt will negotiate 9 stairs with single rail with mod I to indicate increased safety using primary home entrance.    PT SHORT TERM GOAL #5   Title Pt will ambulate >250' over level, indoor surfaces with mod I using LRAD, negotiating obstacles with no overt LOB to indicate increased safety with household mobility.            PT Long Term Goals - 08/26/15 1345    PT LONG TERM GOAL #1   Title Pt will improve Berg score from 39/56 to >/= 45/56 to indicate decreased fall risk.  (Target date: 10/25/15)   PT LONG TERM GOAL #2   Title Pt will negotiate 9 stairs with single rail while carrying simulated grocery bag with mod I, no overt LOB to indicate increased safety carrying groceries into home.  (10/25/15)   PT LONG TERM GOAL #3   Title Pt will transfer from supine on floor to standing using standard chair with mod I, increased time to indicate increased safety/independence with fall recovery.  (10/25/15)   PT LONG TERM GOAL #4   Title Pt will ambulate 500' over unlevel, paved surfaces with mod I using LRAD to indicate increased safety with  community mobility. (10/25/15)   PT LONG TERM GOAL #5   Title Pt will negotiate standard ramp and curb step with mod I to indicate increased safety traversing community obstacles. (10/25/15)   Additional Long Term Goals   Additional Long Term Goals Yes   PT LONG TERM GOAL #6   Title ABC score will improve from 58.1% to >/= 71% to indicate significant increase in balance confidence. (10/25/15)   Status On-going   PT LONG TERM GOAL #7   Title Positional testing will be negative to indicated resolved BPPV.  (10/25/15)   Status New               Plan - 08/26/15 1347    Clinical Impression Statement Pt has sustained 2 falls in past 2 weeks, both of which occurred when pt reaching behind her.  Pt does report longstanding history of vertigo. Assessment of horizontal canal s reveals ageotropic nystagmus with severity of symptoms, duration of nystagmus on R > L. Unable to rule out L horizontal canal cupulolithasis. Therefore, performed L Gufoni x2 trials, after which horizontal canal testing grossly unchanges ageotropic nystagmus). Recommended fast rolling for HC habituation. Pt will benefit from skilled outpatient PT 1x/week for 8 weeks to address BPPV in addition to impairments outlined below.    Rehab Potential Good   Clinical Impairments Affecting Rehab Potential Positive: pt motivation and compliance.  Negative: financial limitations (insurance co-pay), multiple co-morbidities.   PT Frequency 1x / week   PT Duration 8 weeks   PT Treatment/Interventions ADLs/Self Care Home Management;Gait training;Stair training;DME Instruction;Vestibular;Functional mobility training;Neuromuscular re-education;Therapeutic exercise;Therapeutic activities;Patient/family education;Orthotic Fit/Training;Balance training;Manual techniques;Passive range of motion;Canalith Repostioning   PT Next Visit Plan Assess for BPPV (HC) and treat prn. Check STG's.   Consulted and Agree with Plan of Care Patient      Patient  will benefit from skilled therapeutic intervention in order to improve the following deficits and impairments:  Decreased balance, Decreased activity tolerance, Decreased coordination, Decreased strength, Impaired sensation, Pain, Postural dysfunction, Impaired tone, Increased muscle spasms, Impaired perceived functional ability, Impaired UE  functional use, Impaired flexibility, Other (comment), Dizziness (Pain will be monitored but not directly addressed in PT due to nature of referral. )  Visit Diagnosis: Other abnormalities of gait and mobility - Plan: PT plan of care cert/re-cert  BPPV (benign paroxysmal positional vertigo), left - Plan: PT plan of care cert/re-cert  Muscle weakness (generalized) - Plan: PT plan of care cert/re-cert  Unsteadiness on feet - Plan: PT plan of care cert/re-cert  Abnormal posture - Plan: PT plan of care cert/re-cert     Problem List Patient Active Problem List   Diagnosis Date Noted  . Abnormality of gait 07/10/2015  . Cervical spondylosis without myelopathy 02/08/2013  . Neuralgia, neuritis, and radiculitis, unspecified 10/22/2011  . Intractable migraine without aura 10/22/2011   Billie Ruddy, PT, DPT [facility] [address] [place], [place], [address] Phone: [phone]   Fax:  [phone] 08/26/2015, 1:54 PM  Name: CHRISTIAN TREADWAY MRN: [id number] Date of Birth: 1940-05-23

## 2015-08-26 NOTE — Patient Instructions (Addendum)
Toe / Heel Raise (Standing)    Stand with both hands on countertop: raise heels, then rock back on heels and raise toes. Hold for 1-2 seconds. Repeat __15_ times. Perform this exercise __1-2__ times per day.   Abduction: Clam (Eccentric) - Side-Lying    Lie on side with knees bent. Lift top knee, keeping feet together. Hold onto edge of bed with hand of top arm to prevent trunk from moving. Keep trunk steady. Slowly lower for 3-5 seconds. You should feel this in your "back pocket" muscle. _15__ reps per set, _2_ sets per day on each leg. Progress by 2-3 reps at a time, as tolerated.  Balance: Eyes Closed - Bilateral (Varied Surfaces)    Stand with your back to a corner with a stable chair in front of you.   Stand on 1 pillow with feet shoulder width apart, close eyes. Maintain balance __30__ seconds. Repeat __2__ times per set. Do __1-2__ sets per per day.  Tip Card 1.The goal of habituation training is to assist in decreasing symptoms of vertigo, dizziness, or nausea provoked by specific head and body motions. 2.These exercises may initially increase symptoms; however, be persistent and work through symptoms. With repetition and time, the exercises will assist in reducing or eliminating symptoms. 3.Exercises should be stopped and discussed with the therapist if you experience any of the following: - Sudden change or fluctuation in hearing - New onset of ringing in the ears, or increase in current intensity - Any fluid discharge from the ear - Severe pain in neck or back - Extreme nausea  Copyright  [employer]. All rights reserved.  Rolling   With pillow under head, start on back. Roll quickly to your right side.  Hold until dizziness/symptoms stops, plus 20 seconds and then roll quickly to the left side.  Hold until dizziness stops, plus 20 seconds.  Repeat sequence 5 times per session. Do 2 sessions per day.

## 2015-09-02 ENCOUNTER — Ambulatory Visit: Payer: Medicare Other | Admitting: Physical Therapy

## 2015-09-09 ENCOUNTER — Ambulatory Visit: Payer: Medicare Other | Attending: Neurology | Admitting: Physical Therapy

## 2015-09-09 DIAGNOSIS — R2681 Unsteadiness on feet: Secondary | ICD-10-CM | POA: Insufficient documentation

## 2015-09-09 DIAGNOSIS — R2689 Other abnormalities of gait and mobility: Secondary | ICD-10-CM | POA: Insufficient documentation

## 2015-09-09 DIAGNOSIS — H8111 Benign paroxysmal vertigo, right ear: Secondary | ICD-10-CM | POA: Insufficient documentation

## 2015-09-09 NOTE — Therapy (Signed)
[facility] [address] [place], [place], [address] Phone: [phone]   Fax:  [phone]  Physical Therapy Treatment  Patient Details  Name: Kristin Mendez MRN: [id number] Date of Birth: 12-20-1940 Referring Provider: Margette Fast, MD  Encounter Date: 09/09/2015      PT End of Session - 09/09/15 1038    Visit Number 5   Number of Visits 12   Date for PT Re-Evaluation 10/25/15   Authorization Type [employer] Medicare    Authorization Time Period G Codes required   PT Start Time 1014   PT Stop Time 1103   PT Time Calculation (min) 49 min   Activity Tolerance Other (comment)  Pt limited by nausea   Behavior During Therapy [facility] for tasks assessed/performed      Past Medical History  Diagnosis Date  . Headache(784.0)     Chronic daily  . Obesity   . Hypertension   . Myelodysplasia   . Dyslipidemia   . Occipital neuralgia     Right  . Peptic ulcer disease   . Ankle fracture, left   . Arthritis   . Cervical spondylosis without myelopathy 02/08/2013  . Vitamin D deficiency   . Abnormality of gait 07/10/2015    Past Surgical History  Procedure Laterality Date  . Lumbar spine surgery      5 on occasions  . Abdominal hysterectomy    . Dilation and curettage of uterus    . Tubal ligation Bilateral   . Breast lumpectomy    . Tonsillectomy    . Appendectomy    . Ankle fracture surgery Left   . Rotator cuff repair Left   . Cataract extraction, bilateral    . Lumbar laminectomy      Upper pending  . Thoracic laminectomy  9/13    There were no vitals filed for this visit.      Subjective Assessment - 09/09/15 1019    Subjective Pt reports husband has been in hospital again for pneumonia. Pt reports no falls. Reports she experienced episode of dizziness Friday, reporting it was not due to positional change but instead was due to "probably not eating or sleeping much with my husband in the hospital," per pt.     Pertinent History PMH significant for: chronic daily headache, HTN, HLD, occipital neuralgia, L ankle fx, vitamin D deficiency, myelodysplasia (receives injections every 2-3 months), R foot drop, scoliosis, Lumbar surgery x5, thoracic and lumbar laminectomies, B cataract extraction.    Currently in Pain? Yes   Pain Score 8    Pain Location Head   Pain Orientation Right;Other (Comment)  R frontal   Pain Descriptors / Indicators Headache   Pain Type Chronic pain   Pain Onset More than a month ago   Pain Frequency Constant   Aggravating Factors  bright lighting, loud noises, strong smells   Pain Relieving Factors medication   Multiple Pain Sites Yes   Pain Score 7   Pain Location Back   Pain Orientation Right;Left;Lower   Pain Descriptors / Indicators Aching   Pain Type Chronic pain   Pain Onset More than a month ago   Pain Frequency Constant   Aggravating Factors  rainy weather   Pain Relieving Factors analgesic spray,lidocaine patches   Effect of Pain on Daily Activities limits standing and walking tolerance                Vestibular Assessment - 09/09/15 0001    Horizontal Canal Right  Horizontal Canal Right Duration 10 seconds   Horizontal Canal Right Symptoms Ageotrophic;Nystagmus   Horizontal Canal Left   Horizontal Canal Left Duration 15 seconds   Horizontal Canal Left Symptoms Ageotrophic;Nystagmus   Horizontal Canal Right Intensity   Horizontal Canal Right Intensity Mild   Horizontal Canal Left Intensity   Horizontal Canal Left Intensity Moderate                 [facility] Adult PT Treatment/Exercise - 09/09/15 0001    Ambulation/Gait   Ambulation/Gait Yes   Ambulation/Gait Assistance 6: Modified independent (Device/Increase time)   Ambulation Distance (Feet) 250 Feet   Assistive device None   Gait Pattern Step-through pattern;Decreased step length - right;Decreased stance time - left;Decreased dorsiflexion - right;Right circumduction;Lateral trunk  lean to right;Trunk flexed;Poor foot clearance - left;Poor foot clearance - right   Ambulation Surface Level;Indoor   Gait velocity 2.56 ft/sec   Stairs Yes   Stairs Assistance 6: Modified independent (Device/Increase time)   Stair Management Technique One rail Right;Alternating pattern;Forwards   Number of Stairs 12   Height of Stairs 6         Vestibular Treatment/Exercise - 09/09/15 0001    Vestibular Treatment/Exercise   Vestibular Treatment Provided Canalith Repositioning   Canalith Repositioning Appiani Right   Appiani Right   Number of Reps  2   Overall Response  No Change   Response Details  Reassessment of horizontal canals reveals no significant change in ageotropic nystagmus.   OTHER   Comment After R Appian x2, performed R HC Cupulolith Repositioning Maneuver as described by Maudie Mercury, et al (2012), holding each position x2 minutes and using vibration at R mastoid process for initial 30 seconds of positions 1 and 4. Reassessment for [facility] BPPV reveals no change in ageotropic nystagmus, but improved symptoms and decreased duration and amplitude of nystagmus on R. Performed R Gufoni x2 trials. Reassessment reveals decreased amplitude, duration of nystagmus bilaterally.    Horizontal Roll   Number of Reps  3   Symptom Description  Reviewed fast rolling for HC habituation with effective return demo.               PT Education - 09/09/15 1109    Education provided Yes   Education Details BPPV: findings and recommendation for pt to focus on fast rolling HEP.    Person(s) Educated Patient   Methods Explanation;Handout;Verbal cues   Comprehension Verbalized understanding;Returned demonstration          PT Short Term Goals - 09/09/15 1113    PT SHORT TERM GOAL #1   Title Pt will independently perform HEP to maximize functional gains made in PT.   (Target date = 09/02/15)   Baseline Met 6/19.   Status Achieved   PT SHORT TERM GOAL #2   Title Pt will improve Berg score from  39 to >/= 42/56 to indicate progress toward decreased fall risk.    Status On-going   PT SHORT TERM GOAL #3   Title Pt will improve gait velocity from 2.42 ft/sec to > 2.62 ft/sec to indicate status of community ambulator.    Baseline 7/10: gait velocity = 2.56 ft/sec   Status Partially Met   PT SHORT TERM GOAL #4   Title Pt will negotiate 9 stairs with single rail with mod I to indicate increased safety using primary home entrance.    Baseline Met 7/10.    Status Achieved   PT SHORT TERM GOAL #5   Title Pt will ambulate >  250' over level, indoor surfaces with mod I using LRAD, negotiating obstacles with no overt LOB to indicate increased safety with household mobility.    Baseline Met 7/10.   Status Achieved           PT Long Term Goals - 08/26/15 1345    PT LONG TERM GOAL #1   Title Pt will improve Berg score from 39/56 to >/= 45/56 to indicate decreased fall risk.  (Target date: 10/25/15)   PT LONG TERM GOAL #2   Title Pt will negotiate 9 stairs with single rail while carrying simulated grocery bag with mod I, no overt LOB to indicate increased safety carrying groceries into home.  (10/25/15)   PT LONG TERM GOAL #3   Title Pt will transfer from supine on floor to standing using standard chair with mod I, increased time to indicate increased safety/independence with fall recovery.  (10/25/15)   PT LONG TERM GOAL #4   Title Pt will ambulate 500' over unlevel, paved surfaces with mod I using LRAD to indicate increased safety with community mobility. (10/25/15)   PT LONG TERM GOAL #5   Title Pt will negotiate standard ramp and curb step with mod I to indicate increased safety traversing community obstacles. (10/25/15)   Additional Long Term Goals   Additional Long Term Goals Yes   PT LONG TERM GOAL #6   Title ABC score will improve from 58.1% to >/= 71% to indicate significant increase in balance confidence. (10/25/15)   Status On-going   PT LONG TERM GOAL #7   Title Positional testing  will be negative to indicated resolved BPPV.  (10/25/15)   Status New               Plan - 09/09/15 1039    Clinical Impression Statement Reassessment for horizontal canal BPPV reveals ongoing ageotropic nystagmus; however, duration of nystagmus and intensity of symptoms on L > R during this session. Based on presentation, unable to rule out R horizontal canal cupulolithasis.  Treated with R Apiani x2 trials; but horizontal canal testing grossly unchanged. Therefore, treated with R HC Cupulolith Maneuver described by Liborio Nixon al (2012). Also attempted R Gufoni x2 trials with no significant change in nystagmus. Recommending pt focus only on fast rolling for HC habituation at home. Of 4 STG's assessed today, pt has met 3 and partially met 1 (for gait velocity). Will assess Berg at next session.    Rehab Potential Good   Clinical Impairments Affecting Rehab Potential Positive: pt motivation and compliance.  Negative: financial limitations (insurance co-pay), multiple co-morbidities.   PT Frequency 1x / week   PT Duration 8 weeks   PT Treatment/Interventions ADLs/Self Care Home Management;Gait training;Stair training;DME Instruction;Vestibular;Functional mobility training;Neuromuscular re-education;Therapeutic exercise;Therapeutic activities;Patient/family education;Orthotic Fit/Training;Balance training;Manual techniques;Passive range of motion;Canalith Repostioning   PT Next Visit Plan Finish checking STG's. Merrilee Jansky).  Assess for BPPV (HC) and treat prn.    Consulted and Agree with Plan of Care Patient      Patient will benefit from skilled therapeutic intervention in order to improve the following deficits and impairments:  Decreased balance, Decreased activity tolerance, Decreased coordination, Decreased strength, Impaired sensation, Pain, Postural dysfunction, Impaired tone, Increased muscle spasms, Impaired perceived functional ability, Impaired UE functional use, Impaired flexibility, Other  (comment), Dizziness (Pain will be monitored but not directly addressed in PT due to nature of referral)  Visit Diagnosis: BPPV (benign paroxysmal positional vertigo), right  Other abnormalities of gait and mobility  Unsteadiness on feet  Problem List Patient Active Problem List   Diagnosis Date Noted  . Abnormality of gait 07/10/2015  . Cervical spondylosis without myelopathy 02/08/2013  . Neuralgia, neuritis, and radiculitis, unspecified 10/22/2011  . Intractable migraine without aura 10/22/2011    Billie Ruddy, PT, DPT [facility] [address] [place], [place], [address] Phone: [phone]   Fax:  [phone] 09/09/2015, 11:21 AM  Name: Kristin Mendez MRN: [id number] Date of Birth: 1940-07-25

## 2015-09-09 NOTE — Patient Instructions (Signed)
Tip Card 1.The goal of habituation training is to assist in decreasing symptoms of vertigo, dizziness, or nausea provoked by specific head and body motions. 2.These exercises may initially increase symptoms; however, be persistent and work through symptoms. With repetition and time, the exercises will assist in reducing or eliminating symptoms. 3.Exercises should be stopped and discussed with the therapist if you experience any of the following: - Sudden change or fluctuation in hearing - New onset of ringing in the ears, or increase in current intensity - Any fluid discharge from the ear - Severe pain in neck or back - Extreme nausea  Copyright  VHI. All rights reserved.  Rolling   With pillow under head, start on back. Roll quickly to your right side. Hold until dizziness/symptoms stops, plus 20 seconds and then roll quickly to the left side. Hold until dizziness stops, plus 20 seconds. Repeat sequence 5 times per session. Do 2 sessions per day.

## 2015-09-16 ENCOUNTER — Ambulatory Visit: Payer: Medicare Other | Admitting: Physical Therapy

## 2015-09-16 DIAGNOSIS — H8111 Benign paroxysmal vertigo, right ear: Secondary | ICD-10-CM | POA: Diagnosis not present

## 2015-09-16 DIAGNOSIS — R2681 Unsteadiness on feet: Secondary | ICD-10-CM

## 2015-09-16 DIAGNOSIS — R2689 Other abnormalities of gait and mobility: Secondary | ICD-10-CM | POA: Diagnosis not present

## 2015-09-16 NOTE — Patient Instructions (Signed)
Kristin Mendez, try to sleep on your RIGHT side in bed, if possible.  Tip Card 1.The goal of habituation training is to assist in decreasing symptoms of vertigo, dizziness, or nausea provoked by specific head and body motions. 2.These exercises may initially increase symptoms; however, be persistent and work through symptoms. With repetition and time, the exercises will assist in reducing or eliminating symptoms. 3.Exercises should be stopped and discussed with the therapist if you experience any of the following: - Sudden change or fluctuation in hearing - New onset of ringing in the ears, or increase in current intensity - Any fluid discharge from the ear - Severe pain in neck or back - Extreme nausea  Copyright  VHI. All rights reserved.  Rolling   With pillow under head, start on back. Roll quickly to your right side. Hold until dizziness/symptoms stops, plus 20 seconds and then roll quickly to the left side. Hold until dizziness stops, plus 20 seconds. Repeat sequence 5 times per session. Do 2 sessions per day.   SINGLE LIMB STANCE    Stand facing your countertop with both hands over counter.  Try standing on one leg (without holding on, if possible) for 10 consecutive seconds. Practice this on each leg 10 times per day.  Copyright  VHI. All rights reserved.

## 2015-09-16 NOTE — Therapy (Signed)
[facility] [address] [place], [place], [address] Phone: [phone]   Fax:  [phone]  Physical Therapy Treatment  Patient Details  Name: Kristin Mendez MRN: [id number] Date of Birth: 1940/06/21 Referring Provider: Margette Fast, MD  Encounter Date: 09/16/2015      PT End of Session - 09/16/15 1035    Visit Number 6   Number of Visits 12   Date for PT Re-Evaluation 10/25/15   Authorization Type U[employer] Medicare    Authorization Time Period G Codes required   PT Start Time 1015   PT Stop Time 1101   PT Time Calculation (min) 46 min   Activity Tolerance Patient tolerated treatment well   Behavior During Therapy [facility] for tasks assessed/performed      Past Medical History  Diagnosis Date  . Headache(784.0)     Chronic daily  . Obesity   . Hypertension   . Myelodysplasia   . Dyslipidemia   . Occipital neuralgia     Right  . Peptic ulcer disease   . Ankle fracture, left   . Arthritis   . Cervical spondylosis without myelopathy 02/08/2013  . Vitamin D deficiency   . Abnormality of gait 07/10/2015    Past Surgical History  Procedure Laterality Date  . Lumbar spine surgery      5 on occasions  . Abdominal hysterectomy    . Dilation and curettage of uterus    . Tubal ligation Bilateral   . Breast lumpectomy    . Tonsillectomy    . Appendectomy    . Ankle fracture surgery Left   . Rotator cuff repair Left   . Cataract extraction, bilateral    . Lumbar laminectomy      Upper pending  . Thoracic laminectomy  9/13    There were no vitals filed for this visit.      Subjective Assessment - 09/16/15 1017    Subjective "The home exercises make me feel a little bit nauseated, but the dizziness isn't that bad." No falls.   Pertinent History PMH significant for: chronic daily headache, HTN, HLD, occipital neuralgia, L ankle fx, vitamin D deficiency, myelodysplasia (receives injections every 2-3 months), R  foot drop, scoliosis, Lumbar surgery x5, thoracic and lumbar laminectomies, B cataract extraction.    Patient Stated Goals "To stop falling so much."   Currently in Pain? Yes   Pain Score 7    Pain Location Head   Pain Orientation Right;Other (Comment)  R frontal   Pain Descriptors / Indicators Headache   Pain Type Chronic pain   Pain Onset More than a month ago   Aggravating Factors  bright lights, loud noises, storms, strong smells   Pain Relieving Factors medication   Multiple Pain Sites Yes   Pain Score 6   Pain Location Back   Pain Orientation Right;Left;Lower   Pain Descriptors / Indicators Aching   Pain Type Chronic pain   Pain Onset More than a month ago   Pain Frequency Constant   Aggravating Factors  rainy weather   Pain Relieving Factors analgesic spray, lidocaine patches   Effect of Pain on Daily Activities limits standing and walking tolerance                Vestibular Assessment - 09/16/15 0001    Positional Testing   Horizontal Canal Testing Horizontal Canal Right;Horizontal Canal Left;Horizontal Canal Right Intensity;Horizontal Canal Left Intensity   Horizontal Canal Right   Horizontal Canal Right Duration Asymptomatic;  unable to visualize nystagmus unless gaze in ageotropic direction   Horizontal Canal Right Symptoms Normal;Other (comment)  See above.   Horizontal Canal Left   Horizontal Canal Left Duration 15-20 seconds   Horizontal Canal Left Symptoms Ageotrophic;Nystagmus   Horizontal Canal Right Intensity   Horizontal Canal Right Intensity --  NA; none   Horizontal Canal Left Intensity   Horizontal Canal Left Intensity Moderate                 [facility] Adult PT Treatment/Exercise - 09/16/15 0001    Berg Balance Test   Sit to Stand Able to stand without using hands and stabilize independently   Standing Unsupported Able to stand safely 2 minutes   Sitting with Back Unsupported but Feet Supported on Floor or Stool Able to sit safely and  securely 2 minutes   Stand to Sit Sits safely with minimal use of hands   Transfers Able to transfer safely, minor use of hands   Standing Unsupported with Eyes Closed Able to stand 10 seconds with supervision   Standing Ubsupported with Feet Together Able to place feet together independently and stand 1 minute safely   From Standing, Reach Forward with Outstretched Arm Can reach confidently >25 cm (10")   From Standing Position, Pick up Object from Floor Able to pick up shoe, needs supervision   From Standing Position, Turn to Look Behind Over each Shoulder Looks behind one side only/other side shows less weight shift  dec weight shift L   Turn 360 Degrees Able to turn 360 degrees safely one side only in 4 seconds or less  4.94 seconds to R   Standing Unsupported, Alternately Place Feet on Step/Stool Able to stand independently and safely and complete 8 steps in 20 seconds   Standing Unsupported, One Foot in Front Able to plae foot ahead of the other independently and hold 30 seconds   Standing on One Leg Able to lift leg independently and hold 5-10 seconds   Total Score 50         Vestibular Treatment/Exercise - 09/16/15 0001    Vestibular Treatment/Exercise   Vestibular Treatment Provided Canalith Repositioning   Canalith Repositioning Canal Roll Right;Comment   Canal Roll Right   Number of Reps  1   Overall Response  No change   Response Details  After R Canal Roll maneuver, noted ageotropic nystagmus approx 20 seconds on L side, no nystagmus on R side, and symptoms on L > R.   OTHER   Comment R Gufoni x2 trials. Reassessment reveals geotropic nystagmus with symptoms on R > L. Note ageotropic nystagmus for initial 10-15 seconds when in L Canal Roll position.             Balance Exercises - 09/16/15 1129    Balance Exercises: Standing   SLS Eyes open;Solid surface;Intermittent upper extremity support;2 reps;Other (comment)  5-10 seconds per LE           PT Education -  09/16/15 1138    Education provided Yes   Education Details HEP: continue fast rolling for habituation; added SLS; and recommending sleeping on R side, as tolerated, for prolonged positioning. Educated pt on [place] findings, progress, and functional implications.    Person(s) Educated Patient   Methods Demonstration;Verbal cues;Explanation;Handout   Comprehension Verbalized understanding;Returned demonstration          PT Short Term Goals - 09/16/15 1057    PT SHORT TERM GOAL #1   Title Pt will independently perform  HEP to maximize functional gains made in PT.   (Target date = 09/02/15)   Baseline Met 6/19.   Status Achieved   PT SHORT TERM GOAL #2   Title Pt will improve Berg score from 39 to >/= 42/56 to indicate progress toward decreased fall risk.    Baseline 7/17: Berg score = 50/56   Status Achieved   PT SHORT TERM GOAL #3   Title Pt will improve gait velocity from 2.42 ft/sec to > 2.62 ft/sec to indicate status of community ambulator.    Baseline 7/10: gait velocity = 2.56 ft/sec   Status Partially Met   PT SHORT TERM GOAL #4   Title Pt will negotiate 9 stairs with single rail with mod I to indicate increased safety using primary home entrance.    Baseline Met 7/10.    Status Achieved   PT SHORT TERM GOAL #5   Title Pt will ambulate >250' over level, indoor surfaces with mod I using LRAD, negotiating obstacles with no overt LOB to indicate increased safety with household mobility.    Baseline Met 7/10.   Status Achieved           PT Long Term Goals - 09/16/15 1057    PT LONG TERM GOAL #1   Title Pt will improve Berg score from 39/56 to >/= 45/56 to indicate decreased fall risk.  (Target date: 10/25/15)   Baseline 7/17: Berg score = 50/56   Status Achieved   PT LONG TERM GOAL #2   Title Pt will negotiate 9 stairs with single rail while carrying simulated grocery bag with mod I, no overt LOB to indicate increased safety carrying groceries into home.  (10/25/15)   PT LONG  TERM GOAL #3   Title Pt will transfer from supine on floor to standing using standard chair with mod I, increased time to indicate increased safety/independence with fall recovery.  (10/25/15)   PT LONG TERM GOAL #4   Title Pt will ambulate 500' over unlevel, paved surfaces with mod I using LRAD to indicate increased safety with community mobility. (10/25/15)   PT LONG TERM GOAL #5   Title Pt will negotiate standard ramp and curb step with mod I to indicate increased safety traversing community obstacles. (10/25/15)   PT LONG TERM GOAL #6   Title ABC score will improve from 58.1% to >/= 71% to indicate significant increase in balance confidence. (10/25/15)   Status On-going   PT LONG TERM GOAL #7   Title Positional testing will be negative to indicated resolved BPPV.  (10/25/15)   Status On-going               Plan - 09/16/15 1131    Clinical Impression Statement Reassessment for [facility] BPPC reveals ongoing ageotropic nystagmus, still with intensity of symptoms, duration of nystgmus on L > R. Unable to rule out ongoing R HC cupulolithasis. Performed R Gufoni x2 trials. Reassessment of horizontal canals reveals ageotropic nystagmus for initial 10 seconds with assessment of L HC as well as geotropic nystagmus bilaterally. with symptoms on R > L. Therefore, performed R Canal Roll x1, after which PT noted continued agetropic nystagmus on L only with symptoms on L > R. Recommending pt continue fast rollingfor HC habituation at home. Also recommending pt attempt sleeping on R side for prolonged positioning, as tolerated. Berg score improved from 39 to 50/56, suggesting significant decrease in fall risk.    Rehab Potential Good   Clinical Impairments Affecting Rehab Potential Positive: pt  motivation and compliance.  Negative: financial limitations (insurance co-pay), multiple co-morbidities.   PT Frequency 1x / week   PT Duration 8 weeks   PT Treatment/Interventions ADLs/Self Care Home Management;Gait  training;Stair training;DME Instruction;Vestibular;Functional mobility training;Neuromuscular re-education;Therapeutic exercise;Therapeutic activities;Patient/family education;Orthotic Fit/Training;Balance training;Manual techniques;Passive range of motion;Canalith Repostioning   PT Next Visit Plan  Assess for BPPV (HC) and treat prn. Expand on HEP.   Consulted and Agree with Plan of Care Patient      Patient will benefit from skilled therapeutic intervention in order to improve the following deficits and impairments:  Decreased balance, Decreased activity tolerance, Decreased coordination, Decreased strength, Impaired sensation, Pain, Postural dysfunction, Impaired tone, Increased muscle spasms, Impaired perceived functional ability, Impaired UE functional use, Impaired flexibility, Other (comment), Dizziness (PT will monitor pain but will not directly address due to nature of referral.)  Visit Diagnosis: BPPV (benign paroxysmal positional vertigo), right  Other abnormalities of gait and mobility  Unsteadiness on feet     Problem List Patient Active Problem List   Diagnosis Date Noted  . Abnormality of gait 07/10/2015  . Cervical spondylosis without myelopathy 02/08/2013  . Neuralgia, neuritis, and radiculitis, unspecified 10/22/2011  . Intractable migraine without aura 10/22/2011   Billie Ruddy, PT, DPT [facility] [address] [place], [place], [address] Phone: [phone]   Fax:  [phone] 09/16/2015, 11:40 AM  Name: Kristin Mendez MRN: [id number] Date of Birth: [birth year]

## 2015-09-25 ENCOUNTER — Ambulatory Visit: Payer: Medicare Other | Admitting: Physical Therapy

## 2015-09-26 DIAGNOSIS — K580 Irritable bowel syndrome with diarrhea: Secondary | ICD-10-CM | POA: Diagnosis not present

## 2015-09-30 ENCOUNTER — Ambulatory Visit: Payer: Medicare Other | Admitting: Physical Therapy

## 2015-09-30 DIAGNOSIS — R2689 Other abnormalities of gait and mobility: Secondary | ICD-10-CM | POA: Diagnosis not present

## 2015-09-30 DIAGNOSIS — G43909 Migraine, unspecified, not intractable, without status migrainosus: Secondary | ICD-10-CM | POA: Diagnosis not present

## 2015-09-30 DIAGNOSIS — H8111 Benign paroxysmal vertigo, right ear: Secondary | ICD-10-CM | POA: Diagnosis not present

## 2015-09-30 DIAGNOSIS — R2681 Unsteadiness on feet: Secondary | ICD-10-CM | POA: Diagnosis not present

## 2015-09-30 NOTE — Patient Instructions (Signed)
[place], try to sleep on your RIGHT side in bed, if possible.  Tip Card 1.The goal of habituation training is to assist in decreasing symptoms of vertigo, dizziness, or nausea provoked by specific head and body motions. 2.These exercises may initially increase symptoms; however, be persistent and work through symptoms. With repetition and time, the exercises will assist in reducing or eliminating symptoms. 3.Exercises should be stopped and discussed with the therapist if you experience any of the following: - Sudden change or fluctuation in hearing - New onset of ringing in the ears, or increase in current intensity - Any fluid discharge from the ear - Severe pain in neck or back - Extreme nausea  Copyright  VHI. All rights reserved.  Rolling   With pillow under head, start on back. Roll quickly to your right side. Hold until dizziness/symptoms stops, plus 20 seconds and then roll quickly to the left side. Hold until dizziness stops, plus 20 seconds. Repeat sequence 5 times per session. Do 2 sessions per day.   SINGLE LIMB STANCE    Stand facing your countertop with both hands over counter.  Try standing on one leg (without holding on, if possible) for 10 consecutive seconds. Practice this on each leg 10 times per day.    Feet Apart (Compliant Surface) Head Motion - Eyes Closed    Stand with your back to a corner with a stable chair in front of you. Stand on 1 pillow with feet shoulder width apart. Close eyes and move head slowly:  up and down 5 times; right to left 5 times. Repeat __2-3__ times per day.  Copyright  VHI. All rights reserved.

## 2015-09-30 NOTE — Therapy (Signed)
[facility] [address] [place], [place], [address] Phone: [phone]   Fax:  [phone]  Physical Therapy Treatment and Discharge Summary  Patient Details  Name: Kristin Mendez MRN: [id number] Date of Birth: Oct 30, 1940 Referring Provider: Margette Fast, MD  Encounter Date: 09/30/2015      PT End of Session - 09/30/15 1249    Visit Number 7   Number of Visits 12   Date for PT Re-Evaluation 10/25/15   Authorization Type [employer] Medicare    Authorization Time Period G Codes required   PT Start Time 1018   PT Stop Time 1102   PT Time Calculation (min) 44 min   Activity Tolerance Patient tolerated treatment well   Behavior During Therapy [facility] for tasks assessed/performed      Past Medical History:  Diagnosis Date  . Abnormality of gait 07/10/2015  . Ankle fracture, left   . Arthritis   . Cervical spondylosis without myelopathy 02/08/2013  . Dyslipidemia   . Headache(784.0)    Chronic daily  . Hypertension   . Myelodysplasia   . Obesity   . Occipital neuralgia    Right  . Peptic ulcer disease   . Vitamin D deficiency     Past Surgical History:  Procedure Laterality Date  . ABDOMINAL HYSTERECTOMY    . ANKLE FRACTURE SURGERY Left   . APPENDECTOMY    . BREAST LUMPECTOMY    . CATARACT EXTRACTION, BILATERAL    . DILATION AND CURETTAGE OF UTERUS    . LUMBAR LAMINECTOMY     Upper pending  . LUMBAR SPINE SURGERY     5 on occasions  . ROTATOR CUFF REPAIR Left   . THORACIC LAMINECTOMY  9/13  . TONSILLECTOMY    . TUBAL LIGATION Bilateral     There were no vitals filed for this visit.      Subjective Assessment - 09/30/15 1022    Subjective "I had a rough weekend because of the irritable bowel. I haven't fallen though. I haven't been doing those vertigo exercises because my back has been hurting." Also hasn't been able to sleep on R side (prolonged positioning) due to back pain. Pt reports significant  improvement in balance confidence since beginning this episode of PT and feels ready to be discharged at this time.    Pertinent History PMH significant for: chronic daily headache, HTN, HLD, occipital neuralgia, L ankle fx, vitamin D deficiency, myelodysplasia (receives injections every 2-3 months), R foot drop, scoliosis, Lumbar surgery x5, thoracic and lumbar laminectomies, B cataract extraction.    Patient Stated Goals "To stop falling so much."   Currently in Pain? Yes   Pain Score 8    Pain Location Head   Pain Orientation Right  behind R eye   Pain Descriptors / Indicators Headache   Pain Type Chronic pain   Pain Radiating Towards does not radiate   Pain Onset More than a month ago   Pain Frequency Constant   Aggravating Factors  bright lights, loud noises, strong smeels   Pain Relieving Factors medication   Effect of Pain on Daily Activities limits and walking tolerance   Multiple Pain Sites Yes   Pain Score 7   Pain Location Back   Pain Orientation Right;Left;Lower   Pain Descriptors / Indicators Aching   Pain Type Chronic pain   Pain Onset More than a month ago   Pain Frequency Constant   Aggravating Factors  rainy weather   Pain Relieving Factors  analesic spray; lidocaine patches   Effect of Pain on Daily Activities limits standing and walking tolerance                Vestibular Assessment - 09/30/15 0001      Positional Testing   Horizontal Canal Testing Horizontal Canal Right;Horizontal Canal Left;Horizontal Canal Right Intensity;Horizontal Canal Left Intensity     Horizontal Canal Right   Horizontal Canal Right Duration Asymptomatic   Horizontal Canal Right Symptoms Normal     Horizontal Canal Left   Horizontal Canal Left Duration Approx. 15 seconds   Horizontal Canal Left Symptoms Ageotrophic;Nystagmus     Horizontal Canal Right Intensity   Horizontal Canal Right Intensity --  asymptomatic     Horizontal Canal Left Intensity   Horizontal Canal  Left Intensity Moderate                 [facility] Adult PT Treatment/Exercise - 09/30/15 0001      Transfers   Transfers Sit to Stand;Stand to Sit   Sit to Stand 7: Independent     Ambulation/Gait   Ambulation/Gait Yes   Ambulation/Gait Assistance 6: Modified independent (Device/Increase time)   Ambulation/Gait Assistance Details No AD; increased time required.   Ambulation Distance (Feet) 600 Feet   Assistive device None   Gait Pattern Step-through pattern;Decreased step length - right;Decreased stance time - left;Lateral trunk lean to right;Trunk flexed   Ambulation Surface Level;Unlevel;Indoor;Outdoor;Paved   Stairs Yes   Stairs Assistance 6: Modified independent (Device/Increase time)   Stair Management Technique One rail Left;Step to pattern;Forwards  while carrying simulated grocery bag   Number of Stairs 9   Height of Stairs 6         Vestibular Treatment/Exercise - 09/30/15 0001      Vestibular Treatment/Exercise   Vestibular Treatment Provided Habituation     Horizontal Roll   Number of Reps  2   Symptom Description  reviewed fast rolling for HC habituation with effective between-session carryover            Balance Exercises - 09/30/15 1243      Balance Exercises: Standing   Standing Eyes Closed Wide (BOA);Head turns;Foam/compliant surface;5 reps  1 pillow; horizontal, vertical head turns x5 each           PT Education - 09/30/15 1241    Education provided Yes   Education Details PT goals, findings, progress, and DC plan.  Added corner balance to HEP.   Person(s) Educated Patient   Methods Explanation;Demonstration;Verbal cues;Handout   Comprehension Verbalized understanding;Returned demonstration          PT Short Term Goals - 09/16/15 1057      PT SHORT TERM GOAL #1   Title Pt will independently perform HEP to maximize functional gains made in PT.   (Target date = 09/02/15)   Baseline Met 6/19.   Status Achieved     PT SHORT TERM  GOAL #2   Title Pt will improve Berg score from 39 to >/= 42/56 to indicate progress toward decreased fall risk.    Baseline 7/17: Berg score = 50/56   Status Achieved     PT SHORT TERM GOAL #3   Title Pt will improve gait velocity from 2.42 ft/sec to > 2.62 ft/sec to indicate status of community ambulator.    Baseline 7/10: gait velocity = 2.56 ft/sec   Status Partially Met     PT SHORT TERM GOAL #4   Title Pt will negotiate 9 stairs with single  rail with mod I to indicate increased safety using primary home entrance.    Baseline Met 7/10.    Status Achieved     PT SHORT TERM GOAL #5   Title Pt will ambulate >250' over level, indoor surfaces with mod I using LRAD, negotiating obstacles with no overt LOB to indicate increased safety with household mobility.    Baseline Met 7/10.   Status Achieved           PT Long Term Goals - 09/30/15 1026      PT LONG TERM GOAL #1   Title Pt will improve Berg score from 39/56 to >/= 45/56 to indicate decreased fall risk.  (Target date: 10/25/15)   Baseline 7/17: Berg score = 50/56   Status Achieved     PT LONG TERM GOAL #2   Title Pt will negotiate 9 stairs with single rail while carrying simulated grocery bag with mod I, no overt LOB to indicate increased safety carrying groceries into home.  (10/25/15)   Baseline Met 7/31.   Status Achieved     PT LONG TERM GOAL #3   Title Pt will transfer from supine on floor to standing using standard chair with mod I, increased time to indicate increased safety/independence with fall recovery.  (10/25/15)   Baseline Met 7/31.   Status Achieved     PT LONG TERM GOAL #4   Title Pt will ambulate 500' over unlevel, paved surfaces with mod I using LRAD to indicate increased safety with community mobility. (10/25/15)   Baseline Met 7/31.   Status Achieved     PT LONG TERM GOAL #5   Title Pt will negotiate standard ramp and curb step with mod I to indicate increased safety traversing community obstacles.  (10/25/15)   Baseline Met 7/31.   Status Achieved     PT LONG TERM GOAL #6   Title ABC score will improve from 58.1% to >/= 71% to indicate significant increase in balance confidence. (10/25/15)   Baseline 7/31: ABC = 42.5%; however, pt reports significant improvement in balance confidence   Status Not Met     PT LONG TERM GOAL #7   Title Positional testing will be negative to indicated resolved BPPV.  (10/25/15)   Baseline 7/31: Positional testing findings consistent with ongoing R horizontal canal cupulolithiasis; however, decreased amplitude, duration, and symptoms severity.    Status Not Met               Plan - 09/30/15 1052    Clinical Impression Statement Patient has met 5/7 LTG's and demonstrates significant improvement in standing balance and stability/independence with functional mobility. Pt reports marked improvement in balance confidence and has not sustained a fall in several weeks.  Positional testing consistent with ongoing R HC cupulolithasis; however, have attempted all canolith repositioning maneuvers and pt unable to consistently perform habituation exercises at home due to back pain. Pt does report decreased dizziness with rolling and exhibits lower amplitude/shorter duration nystagmus. Pt is aware of habituation HEP and plans to resume when able.  Pt will be discharged from outpatient PT at this time due to significant improvemnet in stability/independence with funcitonal mobility. Pt in full agreement with DC plan.   Consulted and Agree with Plan of Care Patient      Patient will benefit from skilled therapeutic intervention in order to improve the following deficits and impairments:     Visit Diagnosis: Other abnormalities of gait and mobility       G-Codes -  09/30/15 1253    Functional Assessment Tool Used Merrilee Jansky = 50/56   Functional Limitation Mobility: Walking and moving around   Mobility: Walking and Moving Around Goal Status [phone]) At least 1 percent  but less than 20 percent impaired, limited or restricted   Mobility: Walking and Moving Around Discharge Status [phone]) At least 1 percent but less than 20 percent impaired, limited or restricted      Problem List Patient Active Problem List   Diagnosis Date Noted  . Abnormality of gait 07/10/2015  . Cervical spondylosis without myelopathy 02/08/2013  . Neuralgia, neuritis, and radiculitis, unspecified 10/22/2011  . Intractable migraine without aura 10/22/2011  PHYSICAL THERAPY DISCHARGE SUMMARY  Visits from Start of Care: 7  Current functional level related to goals / functional outcomes: See above.   Remaining deficits: See above.   Education / Equipment: See above. HEP for flexibility, LE strengthening, standing balance, habituation, and vestibular reliance.  Plan: Patient agrees to discharge.  Patient goals were partially met. Patient is being discharged due to meeting the stated rehab goals.  ?????         Billie Ruddy, PT, DPT [facility] [address] [place], [place], [address] Phone: [phone]   Fax:  [phone] 09/30/15, 12:54 PM   Name: Kristin Mendez MRN: [id number] Date of Birth: [birth year]

## 2015-10-07 DIAGNOSIS — D461 Refractory anemia with ring sideroblasts: Secondary | ICD-10-CM | POA: Diagnosis not present

## 2015-10-07 DIAGNOSIS — D469 Myelodysplastic syndrome, unspecified: Secondary | ICD-10-CM | POA: Diagnosis not present

## 2015-10-07 DIAGNOSIS — D649 Anemia, unspecified: Secondary | ICD-10-CM | POA: Diagnosis not present

## 2015-10-30 ENCOUNTER — Telehealth: Payer: Self-pay | Admitting: Neurology

## 2015-10-30 ENCOUNTER — Other Ambulatory Visit: Payer: Self-pay | Admitting: *Deleted

## 2015-10-30 ENCOUNTER — Other Ambulatory Visit: Payer: Self-pay | Admitting: Neurology

## 2015-10-30 NOTE — Telephone Encounter (Signed)
He wanted to notify us that we would be receiving a refill request on Stadol nasal spray.  The request was received electronically and refilled.

## 2015-10-30 NOTE — Telephone Encounter (Signed)
Pt called in requesting to speak with Dr. Jannifer Franklin about prescriptions. He just said there was some confusion last time and wants a call back .

## 2015-10-31 ENCOUNTER — Other Ambulatory Visit: Payer: Self-pay | Admitting: Neurology

## 2015-11-01 ENCOUNTER — Telehealth: Payer: Self-pay | Admitting: Neurology

## 2015-11-01 ENCOUNTER — Other Ambulatory Visit: Payer: Self-pay | Admitting: Neurology

## 2015-11-01 NOTE — Telephone Encounter (Signed)
Patient husband called and stated, pharmacy did not receive the stadol Rx for patient. I apologized, but explained, that this is a restricted Rx, which needs to be printed and faxed and that I could not do another Rx today. Phone record from earlier today indicates, Rx was entered today and per RN faxed today, I told patient. Nevertheless, I told husband I would make sure to let Dr. Jannifer Franklin and his nurse know, that thus far pharmacy had not received Rx.

## 2015-11-01 NOTE — Telephone Encounter (Signed)
Spoke to pt's husband. Pharmacy did not receive refill request earlier this week. Reprinted, awaiting signature.

## 2015-11-01 NOTE — Telephone Encounter (Signed)
Rx signed and faxed to pharmacy

## 2015-11-01 NOTE — Telephone Encounter (Signed)
Pt called back said he talked with [facility] and was told they have not rec'd it. Please call

## 2015-11-05 NOTE — Telephone Encounter (Signed)
Called and spoke to pharmacy who reported that they did receive faxed rx for Stadol. Said that they only had 4 out of the 5 requested bottles in stock but that remaining medication should come in today.

## 2015-11-20 DIAGNOSIS — M545 Low back pain: Secondary | ICD-10-CM | POA: Diagnosis not present

## 2015-11-20 DIAGNOSIS — M5431 Sciatica, right side: Secondary | ICD-10-CM | POA: Diagnosis not present

## 2015-11-20 DIAGNOSIS — M5432 Sciatica, left side: Secondary | ICD-10-CM | POA: Diagnosis not present

## 2015-11-28 ENCOUNTER — Other Ambulatory Visit: Payer: Self-pay | Admitting: Neurology

## 2015-11-28 NOTE — Telephone Encounter (Signed)
Rx faxed to pharmacy  

## 2015-11-29 NOTE — Telephone Encounter (Signed)
duplicate

## 2015-12-18 DIAGNOSIS — M779 Enthesopathy, unspecified: Secondary | ICD-10-CM | POA: Diagnosis not present

## 2015-12-18 DIAGNOSIS — M545 Low back pain: Secondary | ICD-10-CM | POA: Diagnosis not present

## 2015-12-18 DIAGNOSIS — M546 Pain in thoracic spine: Secondary | ICD-10-CM | POA: Diagnosis not present

## 2015-12-26 ENCOUNTER — Telehealth: Payer: Self-pay | Admitting: Neurology

## 2015-12-26 NOTE — Telephone Encounter (Signed)
Spouse called back to request early refill for butorphanol (STADOL) 10 MG/ML nasal spray, going out of town tomorrow. Asked spouse if he picked up this medication this morning at [facility]. Patient states he did pick this up. States they pay for (1) and insurance pays for (5), he picked up the (1), this is for the (5).

## 2015-12-26 NOTE — Telephone Encounter (Signed)
Spoke to pharmacist, Marzetta Board. Said that pt does have refills available but not due until Monday. Permission given for pt/husband to pick up refill early tomorrow (Friday) prior to going out of town.

## 2015-12-26 NOTE — Telephone Encounter (Signed)
Kristin Mendez/[facility] [phone] requesting early refill for butorphanol (STADOL) 10 MG/ML nasal spray. She said the pt is going out of town tomorrow and is requesting early refill

## 2015-12-26 NOTE — Telephone Encounter (Signed)
[place] back. Was told that pt's husband just picked up refill.

## 2016-01-07 DIAGNOSIS — M4834 Traumatic spondylopathy, thoracic region: Secondary | ICD-10-CM | POA: Diagnosis not present

## 2016-01-07 DIAGNOSIS — M546 Pain in thoracic spine: Secondary | ICD-10-CM | POA: Diagnosis not present

## 2016-01-07 DIAGNOSIS — M542 Cervicalgia: Secondary | ICD-10-CM | POA: Diagnosis not present

## 2016-01-08 DIAGNOSIS — D469 Myelodysplastic syndrome, unspecified: Secondary | ICD-10-CM | POA: Diagnosis not present

## 2016-01-10 DIAGNOSIS — Z23 Encounter for immunization: Secondary | ICD-10-CM | POA: Diagnosis not present

## 2016-01-15 ENCOUNTER — Encounter: Payer: Self-pay | Admitting: Neurology

## 2016-01-15 ENCOUNTER — Other Ambulatory Visit: Payer: Self-pay

## 2016-01-15 ENCOUNTER — Ambulatory Visit (INDEPENDENT_AMBULATORY_CARE_PROVIDER_SITE_OTHER): Payer: Medicare Other | Admitting: Neurology

## 2016-01-15 VITALS — BP 135/61 | HR 63 | Ht <= 58 in | Wt 130.0 lb

## 2016-01-15 DIAGNOSIS — M47812 Spondylosis without myelopathy or radiculopathy, cervical region: Secondary | ICD-10-CM

## 2016-01-15 DIAGNOSIS — G43019 Migraine without aura, intractable, without status migrainosus: Secondary | ICD-10-CM | POA: Diagnosis not present

## 2016-01-15 MED ORDER — BUTORPHANOL TARTRATE 10 MG/ML NA SOLN: 1.0000 | NASAL | 5 refills | Status: DC | PRN

## 2016-01-15 NOTE — Telephone Encounter (Signed)
Spouse called regarding wrong amount on butorphanol (STADOL) Rx. Please call [phone].

## 2016-01-15 NOTE — Progress Notes (Signed)
Reason for visit: Headache  Kristin Mendez is an 75 y.o. female  History of present illness:  Kristin Mendez is a 75 year old right-handed white female with a history of chronic daily headaches. The patient has had some gait instability, she has undergone physical therapy for gait training, she was felt to have some component of vertigo as well and she underwent some vestibular rehabilitation as well. The patient leaves this has helped, she has had only one fall since last seen, she fell when she made a turn. The patient has had some neck discomfort, she is to undergo a MRI of the cervical spine in the near future in [place], [place]. The patient has some for headaches coming up from the right side of the neck, she does have some right shoulder pain as well. She comes to the office today for an evaluation. She remains on Stadol if needed for headache.  Past Medical History:  Diagnosis Date  . Abnormality of gait 07/10/2015  . Ankle fracture, left   . Arthritis   . Cervical spondylosis without myelopathy 02/08/2013  . Dyslipidemia   . Headache(784.0)    Chronic daily  . Hypertension   . Myelodysplasia   . Obesity   . Occipital neuralgia    Right  . Peptic ulcer disease   . Vitamin D deficiency     Past Surgical History:  Procedure Laterality Date  . ABDOMINAL HYSTERECTOMY    . ANKLE FRACTURE SURGERY Left   . APPENDECTOMY    . BREAST LUMPECTOMY    . CATARACT EXTRACTION, BILATERAL    . DILATION AND CURETTAGE OF UTERUS    . LUMBAR LAMINECTOMY     Upper pending  . LUMBAR SPINE SURGERY     5 on occasions  . ROTATOR CUFF REPAIR Left   . THORACIC LAMINECTOMY  9/13  . TONSILLECTOMY    . TUBAL LIGATION Bilateral     Family History  Problem Relation Age of Onset  . Alzheimer's disease Mother   . Heart failure Mother   . Heart attack Father   . Diabetes Brother   . Bipolar disorder Brother   . Stroke Maternal Grandfather     Social history:  reports that she has never  smoked. She has never used smokeless tobacco. She reports that she does not drink alcohol or use drugs.    Allergies  Allergen Reactions  . Codeine   . Lyrica [Pregabalin]   . Novocain [Procaine Hcl]     Medications:  Prior to Admission medications   Medication Sig Start Date End Date Taking? Authorizing Provider  alosetron (LOTRONEX) 0.5 MG tablet Take 0.5 mg by mouth 2 (two) times daily.   Yes Historical Provider, MD  butorphanol (STADOL) 10 MG/ML nasal spray USE ONE DOSE EVERY 4 HOURS AS NEEDED FOR HEADACHE 11/28/15  Yes Kathrynn Ducking, MD  dicyclomine (BENTYL) 20 MG tablet Take 20 mg by mouth 3 (three) times daily as needed.   Yes Historical Provider, MD  doxepin (SINEQUAN) 100 MG capsule Take 100 mg by mouth at bedtime.   Yes Historical Provider, MD  doxepin (SINEQUAN) 25 MG capsule Take 25 mg by mouth 2 (two) times daily.   Yes Historical Provider, MD  HYDROcodone-acetaminophen (NORCO/VICODIN) 5-325 MG per tablet Take 1 tablet by mouth every 6 (six) hours as needed for moderate pain.   Yes Historical Provider, MD  mometasone (NASONEX) 50 MCG/ACT nasal spray Place 2 sprays into the nose daily.   Yes Historical Provider,  MD  nadolol (CORGARD) 40 MG tablet 1 tablet in the morning, one half tablet in the evening 06/08/12  Yes Kathrynn Ducking, MD  nitroGLYCERIN (NITROSTAT) 0.4 MG SL tablet Place 0.4 mg under the tongue every 5 (five) minutes as needed for chest pain.   Yes Historical Provider, MD  omeprazole (PRILOSEC OTC) 20 MG tablet Take 20 mg by mouth daily.   Yes Historical Provider, MD  rosuvastatin (CRESTOR) 10 MG tablet Take 10 mg by mouth daily.   Yes Historical Provider, MD  tiZANidine (ZANAFLEX) 4 MG tablet Take 4 mg by mouth every 8 (eight) hours as needed for muscle spasms.   Yes Historical Provider, MD    ROS:  Out of a complete 14 system review of symptoms, the patient complains only of the following symptoms, and all other reviewed systems are negative.  Heat  intolerance Leg swelling Diarrhea, nausea Insomnia Joint pain, back pain, achy muscles, neck pain, neck stiffness Moles Bruising easily Decreased concentration  Blood pressure 135/61, pulse 63, height 4\' 9"  (1.448 m), weight 130 lb (59 kg).  Physical Exam  General: The patient is alert and cooperative at the time of the examination.  Skin: No significant peripheral edema is noted.   Neurologic Exam  Mental status: The patient is alert and oriented x 3 at the time of the examination. The patient has apparent normal recent and remote memory, with an apparently normal attention span and concentration ability.   Cranial nerves: Facial symmetry is present. Speech is normal, no aphasia or dysarthria is noted. Extraocular movements are full. Visual fields are full.  Motor: The patient has good strength in all 4 extremities.  Sensory examination: Soft touch sensation is symmetric on the face, arms, and legs.  Coordination: The patient has good finger-nose-finger and heel-to-shin bilaterally.  Gait and station: The patient has a normal gait. Tandem gait is minimally unsteady. Romberg is negative. No drift is seen.  Reflexes: Deep tendon reflexes are symmetric.   Assessment/Plan:  1. Chronic daily headache  2. Cervical spondylosis  3. Gait disturbance  The patient may have some component of cervicogenic headache as well. The patient will be getting MRI evaluation of the cervical spine. A prescription was given for the Stadol today. The patient will follow-up in 6 months, sooner if needed.   Kristin Alexanders MD 01/15/2016 11:16 AM  [facility] [address] [place], [place] [address]  Phone [phone] Fax [phone]

## 2016-01-15 NOTE — Telephone Encounter (Signed)
Spoke to husband. Rx corrected and reprinted.

## 2016-01-15 NOTE — Progress Notes (Signed)
Called and spoke to pt's husband who says that rx for 5 bottles of (2.5 ml) med needs to state "12.5 ml" rather than 15 ml. Rx re-printed for signature. Husband agreed to return incorrect rx to office.

## 2016-01-16 NOTE — Progress Notes (Signed)
New rx signed and placed up front for pick-up.

## 2016-01-17 DIAGNOSIS — M50222 Other cervical disc displacement at C5-C6 level: Secondary | ICD-10-CM | POA: Diagnosis not present

## 2016-01-17 DIAGNOSIS — M47812 Spondylosis without myelopathy or radiculopathy, cervical region: Secondary | ICD-10-CM | POA: Diagnosis not present

## 2016-01-29 DIAGNOSIS — M47813 Spondylosis without myelopathy or radiculopathy, cervicothoracic region: Secondary | ICD-10-CM | POA: Diagnosis not present

## 2016-01-29 DIAGNOSIS — M542 Cervicalgia: Secondary | ICD-10-CM | POA: Diagnosis not present

## 2016-02-25 DIAGNOSIS — Z1231 Encounter for screening mammogram for malignant neoplasm of breast: Secondary | ICD-10-CM | POA: Diagnosis not present

## 2016-03-04 DIAGNOSIS — M25571 Pain in right ankle and joints of right foot: Secondary | ICD-10-CM | POA: Diagnosis not present

## 2016-03-04 DIAGNOSIS — M461 Sacroiliitis, not elsewhere classified: Secondary | ICD-10-CM | POA: Diagnosis not present

## 2016-03-04 DIAGNOSIS — M5432 Sciatica, left side: Secondary | ICD-10-CM | POA: Diagnosis not present

## 2016-03-04 DIAGNOSIS — M5431 Sciatica, right side: Secondary | ICD-10-CM | POA: Diagnosis not present

## 2016-03-05 ENCOUNTER — Telehealth: Payer: Self-pay | Admitting: Neurology

## 2016-03-05 NOTE — Telephone Encounter (Signed)
Patients husband calling requesting an early refill for butorphanol (STADOL) 10 MG/ML nasal spray and butorphanol (STADOL) 10 MG/ML nasal spray.  Patients husband states he will be going out of the country as of  January 9th for 3 weeks.   Please call

## 2016-03-05 NOTE — Telephone Encounter (Signed)
Notified pt's husband via TC.

## 2016-03-05 NOTE — Telephone Encounter (Signed)
Called pharmacy and gave verbal approval for early refill on Stadol nasal spray so that pt can fill prior to going out of town.

## 2016-04-02 DIAGNOSIS — K580 Irritable bowel syndrome with diarrhea: Secondary | ICD-10-CM | POA: Diagnosis not present

## 2016-04-02 DIAGNOSIS — K219 Gastro-esophageal reflux disease without esophagitis: Secondary | ICD-10-CM | POA: Diagnosis not present

## 2016-04-02 DIAGNOSIS — K222 Esophageal obstruction: Secondary | ICD-10-CM | POA: Diagnosis not present

## 2016-04-09 DIAGNOSIS — C9000 Multiple myeloma not having achieved remission: Secondary | ICD-10-CM | POA: Diagnosis not present

## 2016-04-09 DIAGNOSIS — D461 Refractory anemia with ring sideroblasts: Secondary | ICD-10-CM | POA: Diagnosis not present

## 2016-04-10 DIAGNOSIS — D469 Myelodysplastic syndrome, unspecified: Secondary | ICD-10-CM | POA: Diagnosis not present

## 2016-04-10 DIAGNOSIS — Z0001 Encounter for general adult medical examination with abnormal findings: Secondary | ICD-10-CM | POA: Diagnosis not present

## 2016-04-15 DIAGNOSIS — K219 Gastro-esophageal reflux disease without esophagitis: Secondary | ICD-10-CM | POA: Diagnosis not present

## 2016-04-15 DIAGNOSIS — K222 Esophageal obstruction: Secondary | ICD-10-CM | POA: Diagnosis not present

## 2016-04-15 DIAGNOSIS — R1310 Dysphagia, unspecified: Secondary | ICD-10-CM | POA: Diagnosis not present

## 2016-04-15 DIAGNOSIS — K449 Diaphragmatic hernia without obstruction or gangrene: Secondary | ICD-10-CM | POA: Diagnosis not present

## 2016-04-23 DIAGNOSIS — M859 Disorder of bone density and structure, unspecified: Secondary | ICD-10-CM | POA: Diagnosis not present

## 2016-04-23 DIAGNOSIS — Z0000 Encounter for general adult medical examination without abnormal findings: Secondary | ICD-10-CM | POA: Diagnosis not present

## 2016-05-06 DIAGNOSIS — Z0000 Encounter for general adult medical examination without abnormal findings: Secondary | ICD-10-CM | POA: Diagnosis not present

## 2016-05-06 DIAGNOSIS — Z79899 Other long term (current) drug therapy: Secondary | ICD-10-CM | POA: Diagnosis not present

## 2016-05-06 DIAGNOSIS — E785 Hyperlipidemia, unspecified: Secondary | ICD-10-CM | POA: Diagnosis not present

## 2016-06-09 DIAGNOSIS — D461 Refractory anemia with ring sideroblasts: Secondary | ICD-10-CM | POA: Diagnosis not present

## 2016-06-10 DIAGNOSIS — D461 Refractory anemia with ring sideroblasts: Secondary | ICD-10-CM | POA: Diagnosis not present

## 2016-06-18 ENCOUNTER — Other Ambulatory Visit: Payer: Self-pay | Admitting: Neurology

## 2016-06-22 NOTE — Telephone Encounter (Signed)
Placed printed/signed rx's up front for patient pick up.  

## 2016-06-30 DIAGNOSIS — M461 Sacroiliitis, not elsewhere classified: Secondary | ICD-10-CM | POA: Diagnosis not present

## 2016-06-30 DIAGNOSIS — M545 Low back pain: Secondary | ICD-10-CM | POA: Diagnosis not present

## 2016-07-02 DIAGNOSIS — D696 Thrombocytopenia, unspecified: Secondary | ICD-10-CM | POA: Diagnosis not present

## 2016-07-02 DIAGNOSIS — D469 Myelodysplastic syndrome, unspecified: Secondary | ICD-10-CM | POA: Diagnosis not present

## 2016-07-02 DIAGNOSIS — D72819 Decreased white blood cell count, unspecified: Secondary | ICD-10-CM | POA: Diagnosis not present

## 2016-07-29 ENCOUNTER — Ambulatory Visit (INDEPENDENT_AMBULATORY_CARE_PROVIDER_SITE_OTHER): Payer: Medicare Other | Admitting: Neurology

## 2016-07-29 ENCOUNTER — Encounter: Payer: Self-pay | Admitting: Neurology

## 2016-07-29 VITALS — BP 110/63 | HR 74 | Ht <= 58 in | Wt 139.0 lb

## 2016-07-29 DIAGNOSIS — R269 Unspecified abnormalities of gait and mobility: Secondary | ICD-10-CM

## 2016-07-29 DIAGNOSIS — G43019 Migraine without aura, intractable, without status migrainosus: Secondary | ICD-10-CM

## 2016-07-29 NOTE — Progress Notes (Signed)
Reason for visit: Headache  Kristin Mendez is an 76 y.o. female  History of present illness:  Kristin Mendez is a 76 year old right-handed white female with a history of chronic daily headache. The patient indicates that her headaches often times occur around 5 AM, likely representing rebound headache. The patient has been on Botox treatments in the past which resulted in neck weakness. The patient takes Stadol for her headaches, she believes that the headaches are gradually worsening over time. She otherwise is about the same. The patient will have some point in each day where she is incapacitated by the headache and has to lie down. Some days are worse than others. The patient has myelodysplasia, she has recently gotten a whole blood transfusion.  Past Medical History:  Diagnosis Date  . Abnormality of gait 07/10/2015  . Ankle fracture, left   . Arthritis   . Cervical spondylosis without myelopathy 02/08/2013  . Dyslipidemia   . Headache(784.0)    Chronic daily  . Hypertension   . Myelodysplasia   . Obesity   . Occipital neuralgia    Right  . Peptic ulcer disease   . Vitamin D deficiency     Past Surgical History:  Procedure Laterality Date  . ABDOMINAL HYSTERECTOMY    . ANKLE FRACTURE SURGERY Left   . APPENDECTOMY    . BREAST LUMPECTOMY    . CATARACT EXTRACTION, BILATERAL    . DILATION AND CURETTAGE OF UTERUS    . LUMBAR LAMINECTOMY     Upper pending  . LUMBAR SPINE SURGERY     5 on occasions  . ROTATOR CUFF REPAIR Left   . THORACIC LAMINECTOMY  9/13  . TONSILLECTOMY    . TUBAL LIGATION Bilateral     Family History  Problem Relation Age of Onset  . Alzheimer's disease Mother   . Heart failure Mother   . Heart attack Father   . Diabetes Brother   . Bipolar disorder Brother   . Stroke Maternal Grandfather     Social history:  reports that she has never smoked. She has never used smokeless tobacco. She reports that she does not drink alcohol or use drugs.      Allergies  Allergen Reactions  . Codeine   . Lyrica [Pregabalin]   . Novocain [Procaine Hcl]     Medications:  Prior to Admission medications   Medication Sig Start Date End Date Taking? Authorizing Provider  alosetron (LOTRONEX) 0.5 MG tablet Take 0.5 mg by mouth 2 (two) times daily.    Yes [provider]  butorphanol (STADOL) 10 MG/ML nasal spray USE ONE DOSE EVERY 4 HOURS AS NEEDED FOR HEADACHE 06/18/16  Yes Kathrynn Ducking, MD  butorphanol (STADOL) 10 MG/ML nasal spray USE ONE DOSE EVERY 4 HOURS AS NEEDED FOR HEADACHE 06/18/16  Yes Kathrynn Ducking, MD  dicyclomine (BENTYL) 20 MG tablet Take 20 mg by mouth 3 (three) times daily as needed.   Yes [provider]  doxepin (SINEQUAN) 100 MG capsule Take 100 mg by mouth at bedtime.   Yes [provider]  doxepin (SINEQUAN) 25 MG capsule Take 25 mg by mouth 2 (two) times daily.   Yes [provider]  HYDROcodone-acetaminophen (NORCO/VICODIN) 5-325 MG per tablet Take 1 tablet by mouth every 6 (six) hours as needed for moderate pain.   Yes [provider]  mometasone (NASONEX) 50 MCG/ACT nasal spray Place 2 sprays into the nose daily.   Yes [provider]  nadolol (  CORGARD) 40 MG tablet 1 tablet in the morning, one half tablet in the evening 06/08/12  Yes Kathrynn Ducking, MD  nitroGLYCERIN (NITROSTAT) 0.4 MG SL tablet Place 0.4 mg under the tongue every 5 (five) minutes as needed for chest pain.   Yes [provider]  omeprazole (PRILOSEC OTC) 20 MG tablet Take 20 mg by mouth daily.   Yes [provider]  rosuvastatin (CRESTOR) 10 MG tablet Take 10 mg by mouth daily.   Yes [provider]  tiZANidine (ZANAFLEX) 4 MG tablet Take 4 mg by mouth every 8 (eight) hours as needed for muscle spasms.   Yes [provider]  vitamin B-12 (CYANOCOBALAMIN) 1000 MCG tablet Take 1,000 mcg by mouth daily.   Yes [provider]    ROS:  Out of a complete 14  system review of symptoms, the patient complains only of the following symptoms, and all other reviewed systems are negative.  Fatigue Heat intolerance Environmental allergies Anemia Headache Joint pain Insomnia  Blood pressure 110/63, pulse 74, height 4\' 9"  (1.448 m), weight 139 lb (63 kg).  Physical Exam  General: The patient is alert and cooperative at the time of the examination.  Skin: No significant peripheral edema is noted.   Neurologic Exam  Mental status: The patient is alert and oriented x 3 at the time of the examination. The patient has apparent normal recent and remote memory, with an apparently normal attention span and concentration ability.   Cranial nerves: Facial symmetry is present. Speech is normal, no aphasia or dysarthria is noted. Extraocular movements are full. Visual fields are full.  Motor: The patient has good strength in all 4 extremities.  Sensory examination: Soft touch sensation is symmetric on the face, arms, and legs.  Coordination: The patient has good finger-nose-finger and heel-to-shin bilaterally.  Gait and station: The patient has a normal gait. Tandem gait was not tested. Romberg is negative. No drift is seen.  Reflexes: Deep tendon reflexes are symmetric.   Assessment/Plan:  1 intractable migraine headache  The patient likely has migraine as well as rebound type headaches. I discussed the possibility of withdrawing from Stadol, the patient does not appear to be completely interested in this treatment option at this time. The patient may be a candidate for the newer CGRP medication that has been released. She will follow-up in 6 months.   Jill Alexanders MD 07/29/2016 10:43 AM  [facility] [address] [place], [place] [address]  Phone [phone] Fax [phone]

## 2016-07-30 DIAGNOSIS — D469 Myelodysplastic syndrome, unspecified: Secondary | ICD-10-CM | POA: Diagnosis not present

## 2016-07-30 DIAGNOSIS — D461 Refractory anemia with ring sideroblasts: Secondary | ICD-10-CM | POA: Diagnosis not present

## 2016-08-05 DIAGNOSIS — N649 Disorder of breast, unspecified: Secondary | ICD-10-CM | POA: Diagnosis not present

## 2016-08-05 DIAGNOSIS — N6489 Other specified disorders of breast: Secondary | ICD-10-CM | POA: Diagnosis not present

## 2016-08-05 DIAGNOSIS — D461 Refractory anemia with ring sideroblasts: Secondary | ICD-10-CM | POA: Diagnosis not present

## 2016-08-05 DIAGNOSIS — R928 Other abnormal and inconclusive findings on diagnostic imaging of breast: Secondary | ICD-10-CM | POA: Diagnosis not present

## 2016-08-05 DIAGNOSIS — R229 Localized swelling, mass and lump, unspecified: Secondary | ICD-10-CM | POA: Diagnosis not present

## 2016-08-18 DIAGNOSIS — H52223 Regular astigmatism, bilateral: Secondary | ICD-10-CM | POA: Diagnosis not present

## 2016-08-18 DIAGNOSIS — H04123 Dry eye syndrome of bilateral lacrimal glands: Secondary | ICD-10-CM | POA: Diagnosis not present

## 2016-08-27 DIAGNOSIS — D469 Myelodysplastic syndrome, unspecified: Secondary | ICD-10-CM | POA: Diagnosis not present

## 2016-08-27 DIAGNOSIS — D61818 Other pancytopenia: Secondary | ICD-10-CM | POA: Diagnosis not present

## 2016-08-28 DIAGNOSIS — D469 Myelodysplastic syndrome, unspecified: Secondary | ICD-10-CM | POA: Diagnosis not present

## 2016-09-24 DIAGNOSIS — D469 Myelodysplastic syndrome, unspecified: Secondary | ICD-10-CM | POA: Diagnosis not present

## 2016-09-24 DIAGNOSIS — D649 Anemia, unspecified: Secondary | ICD-10-CM | POA: Diagnosis not present

## 2016-09-24 DIAGNOSIS — D61818 Other pancytopenia: Secondary | ICD-10-CM | POA: Diagnosis not present

## 2016-09-25 DIAGNOSIS — D469 Myelodysplastic syndrome, unspecified: Secondary | ICD-10-CM | POA: Diagnosis not present

## 2016-10-09 DIAGNOSIS — D469 Myelodysplastic syndrome, unspecified: Secondary | ICD-10-CM | POA: Diagnosis not present

## 2016-10-13 DIAGNOSIS — K222 Esophageal obstruction: Secondary | ICD-10-CM | POA: Diagnosis not present

## 2016-10-13 DIAGNOSIS — K580 Irritable bowel syndrome with diarrhea: Secondary | ICD-10-CM | POA: Diagnosis not present

## 2016-10-13 DIAGNOSIS — K219 Gastro-esophageal reflux disease without esophagitis: Secondary | ICD-10-CM | POA: Diagnosis not present

## 2016-10-21 DIAGNOSIS — R1310 Dysphagia, unspecified: Secondary | ICD-10-CM | POA: Diagnosis not present

## 2016-10-21 DIAGNOSIS — K222 Esophageal obstruction: Secondary | ICD-10-CM | POA: Diagnosis not present

## 2016-10-21 DIAGNOSIS — K219 Gastro-esophageal reflux disease without esophagitis: Secondary | ICD-10-CM | POA: Diagnosis not present

## 2016-10-22 DIAGNOSIS — D469 Myelodysplastic syndrome, unspecified: Secondary | ICD-10-CM | POA: Diagnosis not present

## 2016-10-22 DIAGNOSIS — D61818 Other pancytopenia: Secondary | ICD-10-CM | POA: Diagnosis not present

## 2016-10-23 DIAGNOSIS — D469 Myelodysplastic syndrome, unspecified: Secondary | ICD-10-CM | POA: Diagnosis not present

## 2016-10-30 DIAGNOSIS — D469 Myelodysplastic syndrome, unspecified: Secondary | ICD-10-CM | POA: Diagnosis not present

## 2016-11-06 DIAGNOSIS — D469 Myelodysplastic syndrome, unspecified: Secondary | ICD-10-CM | POA: Diagnosis not present

## 2016-11-11 DIAGNOSIS — D469 Myelodysplastic syndrome, unspecified: Secondary | ICD-10-CM | POA: Diagnosis not present

## 2016-11-20 DIAGNOSIS — D469 Myelodysplastic syndrome, unspecified: Secondary | ICD-10-CM | POA: Diagnosis not present

## 2016-11-24 DIAGNOSIS — Z23 Encounter for immunization: Secondary | ICD-10-CM | POA: Diagnosis not present

## 2016-11-24 DIAGNOSIS — D4620 Refractory anemia with excess of blasts, unspecified: Secondary | ICD-10-CM | POA: Diagnosis not present

## 2016-11-24 DIAGNOSIS — E782 Mixed hyperlipidemia: Secondary | ICD-10-CM | POA: Diagnosis not present

## 2016-11-24 DIAGNOSIS — I119 Hypertensive heart disease without heart failure: Secondary | ICD-10-CM | POA: Diagnosis not present

## 2016-11-25 ENCOUNTER — Other Ambulatory Visit: Payer: Self-pay | Admitting: Neurology

## 2016-11-25 ENCOUNTER — Telehealth: Payer: Self-pay | Admitting: *Deleted

## 2016-11-25 MED ORDER — BUTORPHANOL TARTRATE 10 MG/ML NA SOLN: NASAL | 5 refills | Status: DC

## 2016-11-25 NOTE — Telephone Encounter (Signed)
Faxed printed/signed rx's for stadol to [facility] in [place] on [address] at [phone]. Received fax confirmation.

## 2016-11-27 DIAGNOSIS — D469 Myelodysplastic syndrome, unspecified: Secondary | ICD-10-CM | POA: Diagnosis not present

## 2016-12-02 DIAGNOSIS — M461 Sacroiliitis, not elsewhere classified: Secondary | ICD-10-CM | POA: Diagnosis not present

## 2016-12-02 DIAGNOSIS — M545 Low back pain: Secondary | ICD-10-CM | POA: Diagnosis not present

## 2016-12-04 DIAGNOSIS — D469 Myelodysplastic syndrome, unspecified: Secondary | ICD-10-CM | POA: Diagnosis not present

## 2016-12-16 DIAGNOSIS — M461 Sacroiliitis, not elsewhere classified: Secondary | ICD-10-CM | POA: Diagnosis not present

## 2016-12-16 DIAGNOSIS — M545 Low back pain: Secondary | ICD-10-CM | POA: Diagnosis not present

## 2016-12-18 DIAGNOSIS — D469 Myelodysplastic syndrome, unspecified: Secondary | ICD-10-CM | POA: Diagnosis not present

## 2016-12-25 DIAGNOSIS — D469 Myelodysplastic syndrome, unspecified: Secondary | ICD-10-CM | POA: Diagnosis not present

## 2016-12-31 DIAGNOSIS — D469 Myelodysplastic syndrome, unspecified: Secondary | ICD-10-CM | POA: Diagnosis not present

## 2017-01-01 DIAGNOSIS — D469 Myelodysplastic syndrome, unspecified: Secondary | ICD-10-CM | POA: Diagnosis not present

## 2017-01-06 DIAGNOSIS — M546 Pain in thoracic spine: Secondary | ICD-10-CM | POA: Diagnosis not present

## 2017-01-06 DIAGNOSIS — M4834 Traumatic spondylopathy, thoracic region: Secondary | ICD-10-CM | POA: Diagnosis not present

## 2017-01-08 DIAGNOSIS — Z5189 Encounter for other specified aftercare: Secondary | ICD-10-CM | POA: Diagnosis not present

## 2017-01-08 DIAGNOSIS — D469 Myelodysplastic syndrome, unspecified: Secondary | ICD-10-CM | POA: Diagnosis not present

## 2017-01-22 DIAGNOSIS — D469 Myelodysplastic syndrome, unspecified: Secondary | ICD-10-CM | POA: Diagnosis not present

## 2017-01-29 DIAGNOSIS — D469 Myelodysplastic syndrome, unspecified: Secondary | ICD-10-CM | POA: Diagnosis not present

## 2017-02-03 ENCOUNTER — Encounter: Payer: Self-pay | Admitting: Neurology

## 2017-02-03 ENCOUNTER — Encounter: Payer: Self-pay | Admitting: *Deleted

## 2017-02-03 ENCOUNTER — Ambulatory Visit: Payer: Medicare Other | Admitting: Neurology

## 2017-02-03 ENCOUNTER — Telehealth: Payer: Self-pay | Admitting: *Deleted

## 2017-02-03 VITALS — BP 135/55 | HR 74 | Wt 131.0 lb

## 2017-02-03 DIAGNOSIS — H532 Diplopia: Secondary | ICD-10-CM | POA: Diagnosis not present

## 2017-02-03 DIAGNOSIS — E538 Deficiency of other specified B group vitamins: Secondary | ICD-10-CM | POA: Diagnosis not present

## 2017-02-03 DIAGNOSIS — G43019 Migraine without aura, intractable, without status migrainosus: Secondary | ICD-10-CM

## 2017-02-03 MED ORDER — ALPRAZOLAM 0.5 MG PO TABS: ORAL_TABLET | ORAL | 0 refills | Status: DC

## 2017-02-03 MED ORDER — ERENUMAB-AOOE 70 MG/ML ~~LOC~~ SOAJ: 140.0000 mg | SUBCUTANEOUS | 3 refills | Status: DC

## 2017-02-03 NOTE — Progress Notes (Signed)
Reason for visit: Headache  Kristin Mendez is an 76 y.o. female  History of present illness:  Kristin Mendez is a 76 year old right-handed white female with a history of chronic daily headaches.  The patient is on Stadol on a regular basis for this, she has been on doxepin and the dose has been gradually reduced, she is on 50 mg at night.  The patient has had some increase in headaches, she believes that it is related to the dose increase of her Procrit that she is taking for her chronic anemia.  She is now getting injections once a week.  Within the last 2-3 weeks she has developed vertical diplopia that may come and go.  When she gets double vision, she will have significant nausea and some vomiting during the period of diplopia.  The patient claims that her eyes feel heavy at times, particularly on the left side. She has not noted any ptosis. The patient has not had any other new symptoms of weakness or numbness of the extremities, no change in speech or swallowing, no changes in balance.  The patient returns to this office for an evaluation.  Past Medical History:  Diagnosis Date  . Abnormality of gait 07/10/2015  . Ankle fracture, left   . Arthritis   . Cervical spondylosis without myelopathy 02/08/2013  . Dyslipidemia   . Headache(784.0)    Chronic daily  . Hypertension   . Myelodysplasia   . Obesity   . Occipital neuralgia    Right  . Peptic ulcer disease   . Vitamin D deficiency     Past Surgical History:  Procedure Laterality Date  . ABDOMINAL HYSTERECTOMY    . ANKLE FRACTURE SURGERY Left   . APPENDECTOMY    . BREAST LUMPECTOMY    . CATARACT EXTRACTION, BILATERAL    . DILATION AND CURETTAGE OF UTERUS    . LUMBAR LAMINECTOMY     Upper pending  . LUMBAR SPINE SURGERY     5 on occasions  . ROTATOR CUFF REPAIR Left   . THORACIC LAMINECTOMY  9/13  . TONSILLECTOMY    . TUBAL LIGATION Bilateral     Family History  Problem Relation Age of Onset  . Alzheimer's disease  Mother   . Heart failure Mother   . Heart attack Father   . Diabetes Brother   . Bipolar disorder Brother   . Stroke Maternal Grandfather     Social history:  reports that  has never smoked. she has never used smokeless tobacco. She reports that she does not drink alcohol or use drugs.    Allergies  Allergen Reactions  . Codeine   . Lyrica [Pregabalin]   . Novocain [Procaine Hcl]     Medications:  Prior to Admission medications   Medication Sig Start Date End Date Taking? Authorizing Provider  alosetron (LOTRONEX) 0.5 MG tablet Take 0.5 mg by mouth 2 (two) times daily.    Yes [provider]  butorphanol (STADOL) 10 MG/ML nasal spray USE ONE DOSE EVERY 4 HOURS AS NEEDED FOR HEADACHE 11/25/16  Yes Kathrynn Ducking, MD  butorphanol (STADOL) 10 MG/ML nasal spray USE ONE DOSE EVERY 4 HOURS AS NEEDED FOR HEADACHE 11/25/16  Yes Kathrynn Ducking, MD  dicyclomine (BENTYL) 20 MG tablet Take 20 mg by mouth 3 (three) times daily as needed.   Yes [provider]  doxepin (SINEQUAN) 25 MG capsule Take 50 mg by mouth at bedtime.    Yes [provider]  HYDROcodone-acetaminophen (NORCO/VICODIN) 5-325 MG per tablet Take 1 tablet by mouth every 6 (six) hours as needed for moderate pain.   Yes [provider]  mometasone (NASONEX) 50 MCG/ACT nasal spray Place 2 sprays into the nose daily.   Yes [provider]  nadolol (CORGARD) 40 MG tablet 1 tablet in the morning, one half tablet in the evening 06/08/12  Yes Kathrynn Ducking, MD  nitroGLYCERIN (NITROSTAT) 0.4 MG SL tablet Place 0.4 mg under the tongue every 5 (five) minutes as needed for chest pain.   Yes [provider]  omeprazole (PRILOSEC OTC) 20 MG tablet Take 20 mg by mouth 2 (two) times daily.    Yes [provider]  rosuvastatin (CRESTOR) 10 MG tablet Take 10 mg by mouth daily.   Yes [provider]  tiZANidine (ZANAFLEX) 4 MG tablet Take 4 mg by mouth every 8 (eight)  hours as needed for muscle spasms.   Yes [provider]  vitamin B-12 (CYANOCOBALAMIN) 1000 MCG tablet Take 1,000 mcg by mouth daily.   Yes [provider]    ROS:  Out of a complete 14 system review of symptoms, the patient complains only of the following symptoms, and all other reviewed systems are negative.   Headache, double vision  Blood pressure (!) 135/55, pulse 74, weight 131 lb (59.4 kg), SpO2 98 %.  Physical Exam  General: The patient is alert and cooperative at the time of the examination.  Skin: No significant peripheral edema is noted.   Neurologic Exam  Mental status: The patient is alert and oriented x 3 at the time of the examination. The patient has apparent normal recent and remote memory, with an apparently normal attention span and concentration ability.   Cranial nerves: Facial symmetry is present. Speech is normal, no aphasia or dysarthria is noted. Extraocular movements are full. Visual fields are full.  Cover test is negative.  Motor: The patient has good strength in all 4 extremities.  Sensory examination: Soft touch sensation is symmetric on the face, arms, and legs.  Coordination: The patient has good finger-nose-finger and heel-to-shin bilaterally.  Gait and station: The patient has a normal gait. Tandem gait is unsteady. Romberg is negative. No drift is seen.  Reflexes: Deep tendon reflexes are symmetric.   Assessment/Plan:  1.  Episodic vertical diplopia  2.  Chronic daily headache  The patient is having a new symptom of double vision.  The patient will undergo blood work today and she will have MRI evaluation of the brain.  The patient will be given a prescription for Aimovig for the headache.  Her headache has worsened recently.  She will remain on Stadol if needed.  She will follow-up in 6 months.  Jill Alexanders MD 02/03/2017 10:33 AM  [facility] [address] [place], [place]  [address]  Phone [phone] Fax [phone]

## 2017-02-03 NOTE — Patient Instructions (Signed)
   We will check MRI of the brain and get blood work.

## 2017-02-03 NOTE — Telephone Encounter (Signed)
Called and spoke with husband/patient. Advised I am working on [employer]. Need to know previously tried/failed medications.  She thinks she has tried/failed the following: amitriptyline, depakote (she had SE), topamax (she had SE), atenolol, propranolol, nadolol. She does not remember specific dates she has tried medications.  She does not think she has tried: effexor, timolol or metoprolol

## 2017-02-04 NOTE — Telephone Encounter (Signed)
I have spoken with pt. and explained that insurance has approved Ajovy.  After she picks rx. up from the pharmacy, she should call our office back to schedule and appt. to come in for inj. training.  She verbalized understanding of same/fim

## 2017-02-04 NOTE — Telephone Encounter (Signed)
Submitted PA Aimovig on covermymeds. Key: [id number]. Awaiting determination from [employer].

## 2017-02-04 NOTE — Telephone Encounter (Signed)
Received fax notification from [employer] that PA aimovig approved through 03/01/18 under Medicare Part D. PA#: [id number].   Faxed notification of approval to [employer] at [phone]. Received fax confirmation.

## 2017-02-05 DIAGNOSIS — D641 Secondary sideroblastic anemia due to disease: Secondary | ICD-10-CM | POA: Diagnosis not present

## 2017-02-05 LAB — ACETYLCHOLINE RECEPTOR, BINDING: AChR Binding Ab, Serum: <0.03 nmol/L (ref 0.00–0.24)

## 2017-02-05 LAB — ANA W/REFLEX: ANA: NEGATIVE

## 2017-02-05 LAB — ANGIOTENSIN CONVERTING ENZYME: Angio Convert Enzyme: 25 U/L (ref 14–82)

## 2017-02-05 LAB — VITAMIN B12: Vitamin B-12: 1401 pg/mL — ABNORMAL HIGH (ref 232–1245)

## 2017-02-05 LAB — B. BURGDORFI ANTIBODIES

## 2017-02-05 LAB — SEDIMENTATION RATE: SED RATE: 3 mm/h (ref 0–40)

## 2017-02-11 DIAGNOSIS — D641 Secondary sideroblastic anemia due to disease: Secondary | ICD-10-CM | POA: Diagnosis not present

## 2017-02-11 DIAGNOSIS — D469 Myelodysplastic syndrome, unspecified: Secondary | ICD-10-CM | POA: Diagnosis not present

## 2017-02-12 DIAGNOSIS — D469 Myelodysplastic syndrome, unspecified: Secondary | ICD-10-CM | POA: Diagnosis not present

## 2017-02-12 DIAGNOSIS — D641 Secondary sideroblastic anemia due to disease: Secondary | ICD-10-CM | POA: Diagnosis not present

## 2017-02-15 NOTE — Telephone Encounter (Signed)
Pt husband states if an appointment needs to be set for injection training to please call, if no call back is received they will come in on a day outside of 12-24 & 12-25

## 2017-02-15 NOTE — Telephone Encounter (Signed)
Called and spoke with husband. R/s injection training for next Wednesday 02/24/17 at 2:45pm. Verified they are keeping medication in the fridge.

## 2017-02-15 NOTE — Telephone Encounter (Signed)
Pt husband has called to inform that pt will need to come in a day next week instead of this week re: the injection training.  Pt husband made aware the office will not be open Mon and Tues of next week.

## 2017-02-17 ENCOUNTER — Encounter (HOSPITAL_COMMUNITY): Payer: Self-pay

## 2017-02-17 ENCOUNTER — Inpatient Hospital Stay (HOSPITAL_COMMUNITY)
Admission: EM | Admit: 2017-02-17 | Discharge: 2017-02-19 | DRG: 040 | Disposition: A | Discharge status: Home / Self Care | Payer: Medicare Other | Attending: Internal Medicine | Admitting: Internal Medicine

## 2017-02-17 ENCOUNTER — Inpatient Hospital Stay (HOSPITAL_COMMUNITY): Payer: Medicare Other

## 2017-02-17 ENCOUNTER — Other Ambulatory Visit: Payer: Self-pay

## 2017-02-17 ENCOUNTER — Telehealth: Payer: Self-pay | Admitting: Neurology

## 2017-02-17 ENCOUNTER — Ambulatory Visit
Admission: RE | Admit: 2017-02-17 | Discharge: 2017-02-17 | Disposition: A | Discharge status: Home / Self Care | Payer: Medicare Other | Source: Ambulatory Visit | Attending: Neurology | Admitting: Neurology

## 2017-02-17 DIAGNOSIS — M5481 Occipital neuralgia: Secondary | ICD-10-CM | POA: Diagnosis not present

## 2017-02-17 DIAGNOSIS — I63411 Cerebral infarction due to embolism of right middle cerebral artery: Secondary | ICD-10-CM | POA: Diagnosis not present

## 2017-02-17 DIAGNOSIS — G936 Cerebral edema: Secondary | ICD-10-CM | POA: Diagnosis not present

## 2017-02-17 DIAGNOSIS — M1990 Unspecified osteoarthritis, unspecified site: Secondary | ICD-10-CM | POA: Diagnosis not present

## 2017-02-17 DIAGNOSIS — F688 Other specified disorders of adult personality and behavior: Secondary | ICD-10-CM | POA: Diagnosis present

## 2017-02-17 DIAGNOSIS — I10 Essential (primary) hypertension: Secondary | ICD-10-CM | POA: Diagnosis not present

## 2017-02-17 DIAGNOSIS — G43019 Migraine without aura, intractable, without status migrainosus: Secondary | ICD-10-CM | POA: Diagnosis present

## 2017-02-17 DIAGNOSIS — I639 Cerebral infarction, unspecified: Secondary | ICD-10-CM | POA: Diagnosis not present

## 2017-02-17 DIAGNOSIS — K222 Esophageal obstruction: Secondary | ICD-10-CM | POA: Diagnosis not present

## 2017-02-17 DIAGNOSIS — I081 Rheumatic disorders of both mitral and tricuspid valves: Secondary | ICD-10-CM | POA: Diagnosis not present

## 2017-02-17 DIAGNOSIS — Z8673 Personal history of transient ischemic attack (TIA), and cerebral infarction without residual deficits: Secondary | ICD-10-CM | POA: Diagnosis not present

## 2017-02-17 DIAGNOSIS — Z8711 Personal history of peptic ulcer disease: Secondary | ICD-10-CM

## 2017-02-17 DIAGNOSIS — Z888 Allergy status to other drugs, medicaments and biological substances status: Secondary | ICD-10-CM

## 2017-02-17 DIAGNOSIS — I629 Nontraumatic intracranial hemorrhage, unspecified: Secondary | ICD-10-CM | POA: Diagnosis present

## 2017-02-17 DIAGNOSIS — Z884 Allergy status to anesthetic agent status: Secondary | ICD-10-CM

## 2017-02-17 DIAGNOSIS — H532 Diplopia: Secondary | ICD-10-CM | POA: Diagnosis present

## 2017-02-17 DIAGNOSIS — K219 Gastro-esophageal reflux disease without esophagitis: Secondary | ICD-10-CM | POA: Diagnosis present

## 2017-02-17 DIAGNOSIS — I63511 Cerebral infarction due to unspecified occlusion or stenosis of right middle cerebral artery: Principal | ICD-10-CM | POA: Diagnosis present

## 2017-02-17 DIAGNOSIS — Z8249 Family history of ischemic heart disease and other diseases of the circulatory system: Secondary | ICD-10-CM

## 2017-02-17 DIAGNOSIS — E538 Deficiency of other specified B group vitamins: Secondary | ICD-10-CM | POA: Diagnosis present

## 2017-02-17 DIAGNOSIS — R410 Disorientation, unspecified: Secondary | ICD-10-CM | POA: Diagnosis not present

## 2017-02-17 DIAGNOSIS — E785 Hyperlipidemia, unspecified: Secondary | ICD-10-CM | POA: Diagnosis present

## 2017-02-17 DIAGNOSIS — Z79899 Other long term (current) drug therapy: Secondary | ICD-10-CM

## 2017-02-17 DIAGNOSIS — Z823 Family history of stroke: Secondary | ICD-10-CM

## 2017-02-17 DIAGNOSIS — E559 Vitamin D deficiency, unspecified: Secondary | ICD-10-CM | POA: Diagnosis present

## 2017-02-17 DIAGNOSIS — I679 Cerebrovascular disease, unspecified: Secondary | ICD-10-CM | POA: Diagnosis present

## 2017-02-17 DIAGNOSIS — E876 Hypokalemia: Secondary | ICD-10-CM | POA: Diagnosis not present

## 2017-02-17 DIAGNOSIS — R29700 NIHSS score 0: Secondary | ICD-10-CM | POA: Diagnosis not present

## 2017-02-17 DIAGNOSIS — I619 Nontraumatic intracerebral hemorrhage, unspecified: Secondary | ICD-10-CM | POA: Diagnosis not present

## 2017-02-17 DIAGNOSIS — D696 Thrombocytopenia, unspecified: Secondary | ICD-10-CM | POA: Diagnosis present

## 2017-02-17 DIAGNOSIS — Z885 Allergy status to narcotic agent status: Secondary | ICD-10-CM

## 2017-02-17 DIAGNOSIS — I6389 Other cerebral infarction: Secondary | ICD-10-CM | POA: Diagnosis not present

## 2017-02-17 DIAGNOSIS — D61818 Other pancytopenia: Secondary | ICD-10-CM | POA: Diagnosis present

## 2017-02-17 DIAGNOSIS — I672 Cerebral atherosclerosis: Secondary | ICD-10-CM | POA: Diagnosis present

## 2017-02-17 DIAGNOSIS — D469 Myelodysplastic syndrome, unspecified: Secondary | ICD-10-CM | POA: Diagnosis not present

## 2017-02-17 DIAGNOSIS — Z538 Procedure and treatment not carried out for other reasons: Secondary | ICD-10-CM | POA: Diagnosis not present

## 2017-02-17 DIAGNOSIS — Z90710 Acquired absence of both cervix and uterus: Secondary | ICD-10-CM

## 2017-02-17 DIAGNOSIS — I361 Nonrheumatic tricuspid (valve) insufficiency: Secondary | ICD-10-CM | POA: Diagnosis not present

## 2017-02-17 DIAGNOSIS — G43909 Migraine, unspecified, not intractable, without status migrainosus: Secondary | ICD-10-CM | POA: Diagnosis not present

## 2017-02-17 LAB — CBC
HCT: 31.5 % — ABNORMAL LOW (ref 36.0–46.0)
HEMOGLOBIN: 9.9 g/dL — AB (ref 12.0–15.0)
MCH: 32 pg (ref 26.0–34.0)
MCHC: 31.4 g/dL (ref 30.0–36.0)
MCV: 101.9 fL — ABNORMAL HIGH (ref 78.0–100.0)
Platelets: 75 10*3/uL — ABNORMAL LOW (ref 150–400)
RBC: 3.09 MIL/uL — AB (ref 3.87–5.11)
RDW: 21.5 % — ABNORMAL HIGH (ref 11.5–15.5)
WBC: 2.6 10*3/uL — AB (ref 4.0–10.5)

## 2017-02-17 LAB — DIFFERENTIAL
Basophils Absolute: 0 10*3/uL (ref 0.0–0.1)
Basophils Relative: 0 %
EOS ABS: 0 10*3/uL (ref 0.0–0.7)
Eosinophils Relative: 0 %
LYMPHS PCT: 58 %
Lymphs Abs: 1.5 10*3/uL (ref 0.7–4.0)
MONOS PCT: 6 %
Monocytes Absolute: 0.2 10*3/uL (ref 0.1–1.0)
NEUTROS ABS: 0.9 10*3/uL — AB (ref 1.7–7.7)
NEUTROS PCT: 36 %

## 2017-02-17 LAB — URINALYSIS, ROUTINE W REFLEX MICROSCOPIC
Bilirubin Urine: NEGATIVE
GLUCOSE, UA: NEGATIVE mg/dL
Hgb urine dipstick: NEGATIVE
KETONES UR: NEGATIVE mg/dL
LEUKOCYTES UA: NEGATIVE
Nitrite: NEGATIVE
PROTEIN: NEGATIVE mg/dL
Specific Gravity, Urine: 1.006 (ref 1.005–1.030)
pH: 7 (ref 5.0–8.0)

## 2017-02-17 LAB — COMPREHENSIVE METABOLIC PANEL
ALBUMIN: 4.4 g/dL (ref 3.5–5.0)
ALK PHOS: 57 U/L (ref 38–126)
ALT: 11 U/L — AB (ref 14–54)
AST: 15 U/L (ref 15–41)
Anion gap: 6 (ref 5–15)
BUN: 9 mg/dL (ref 6–20)
CALCIUM: 9.2 mg/dL (ref 8.9–10.3)
CHLORIDE: 106 mmol/L (ref 101–111)
CO2: 25 mmol/L (ref 22–32)
CREATININE: 0.64 mg/dL (ref 0.44–1.00)
GFR calc non Af Amer: >60 mL/min (ref >60)
GLUCOSE: 113 mg/dL — AB (ref 65–99)
Potassium: 3.4 mmol/L — ABNORMAL LOW (ref 3.5–5.1)
SODIUM: 137 mmol/L (ref 135–145)
Total Bilirubin: 1.6 mg/dL — ABNORMAL HIGH (ref 0.3–1.2)
Total Protein: 6.5 g/dL (ref 6.5–8.1)

## 2017-02-17 LAB — RAPID URINE DRUG SCREEN, HOSP PERFORMED
Amphetamines: NOT DETECTED
BARBITURATES: NOT DETECTED
Benzodiazepines: POSITIVE — AB
COCAINE: NOT DETECTED
Opiates: POSITIVE — AB
TETRAHYDROCANNABINOL: NOT DETECTED

## 2017-02-17 LAB — PROTIME-INR
INR: 1.01
Prothrombin Time: 13.2 seconds (ref 11.4–15.2)

## 2017-02-17 LAB — ETHANOL: Alcohol, Ethyl (B): <10 mg/dL (ref <10)

## 2017-02-17 LAB — APTT: aPTT: 30 seconds (ref 24–36)

## 2017-02-17 MED ORDER — NITROGLYCERIN 0.4 MG SL SUBL: 0.4000 mg | SUBLINGUAL_TABLET | SUBLINGUAL | Status: DC | PRN

## 2017-02-17 MED ORDER — BUTORPHANOL TARTRATE 10 MG/ML NA SOLN
1.0000 | NASAL | Status: DC | PRN
  Administered 2017-02-17 – 2017-02-18 (×5): 1 via NASAL
  Filled 2017-02-17 (×2): qty 2.5

## 2017-02-17 MED ORDER — ASPIRIN 325 MG PO TABS
325.0000 mg | ORAL_TABLET | Freq: Every day | ORAL | Status: DC
  Administered 2017-02-17: 325 mg via ORAL
  Filled 2017-02-17: qty 1

## 2017-02-17 MED ORDER — TRIAMCINOLONE ACETONIDE 55 MCG/ACT NA AERO: 2.0000 | INHALATION_SPRAY | Freq: Every day | NASAL | Status: DC | PRN

## 2017-02-17 MED ORDER — ACETAMINOPHEN 160 MG/5ML PO SOLN: 650.0000 mg | ORAL | Status: DC | PRN

## 2017-02-17 MED ORDER — VITAMIN B-12 1000 MCG PO TABS
1000.0000 ug | ORAL_TABLET | Freq: Every day | ORAL | Status: DC
  Administered 2017-02-18 – 2017-02-19 (×2): 1000 ug via ORAL
  Filled 2017-02-17 (×2): qty 1

## 2017-02-17 MED ORDER — STROKE: EARLY STAGES OF RECOVERY BOOK
Freq: Once | Status: AC
  Administered 2017-02-17: 1
  Filled 2017-02-17: qty 1

## 2017-02-17 MED ORDER — ACETAMINOPHEN 650 MG RE SUPP: 650.0000 mg | RECTAL | Status: DC | PRN

## 2017-02-17 MED ORDER — ASPIRIN EC 81 MG PO TBEC
81.0000 mg | DELAYED_RELEASE_TABLET | Freq: Every day | ORAL | Status: DC
  Administered 2017-02-18 – 2017-02-19 (×2): 81 mg via ORAL
  Filled 2017-02-17 (×2): qty 1

## 2017-02-17 MED ORDER — ROSUVASTATIN CALCIUM 5 MG PO TABS
10.0000 mg | ORAL_TABLET | Freq: Every day | ORAL | Status: DC
  Administered 2017-02-17 – 2017-02-18 (×2): 10 mg via ORAL
  Filled 2017-02-17 (×3): qty 2
  Filled 2017-02-17: qty 1

## 2017-02-17 MED ORDER — ENSURE ENLIVE PO LIQD
237.0000 mL | Freq: Two times a day (BID) | ORAL | Status: DC
  Administered 2017-02-18 – 2017-02-19 (×4): 237 mL via ORAL

## 2017-02-17 MED ORDER — SENNOSIDES-DOCUSATE SODIUM 8.6-50 MG PO TABS: 1.0000 | ORAL_TABLET | Freq: Every evening | ORAL | Status: DC | PRN

## 2017-02-17 MED ORDER — ENOXAPARIN SODIUM 40 MG/0.4ML ~~LOC~~ SOLN
40.0000 mg | SUBCUTANEOUS | Status: DC
  Administered 2017-02-17 – 2017-02-18 (×2): 40 mg via SUBCUTANEOUS
  Filled 2017-02-17 (×2): qty 0.4

## 2017-02-17 MED ORDER — PROPRANOLOL HCL 40 MG PO TABS
40.0000 mg | ORAL_TABLET | Freq: Two times a day (BID) | ORAL | Status: DC
  Administered 2017-02-17 – 2017-02-19 (×4): 40 mg via ORAL
  Filled 2017-02-17 (×5): qty 1

## 2017-02-17 MED ORDER — PANTOPRAZOLE SODIUM 40 MG PO TBEC
80.0000 mg | DELAYED_RELEASE_TABLET | Freq: Every day | ORAL | Status: DC
  Administered 2017-02-17 – 2017-02-19 (×3): 80 mg via ORAL
  Filled 2017-02-17 (×3): qty 2

## 2017-02-17 MED ORDER — TIZANIDINE HCL 4 MG PO TABS: 4.0000 mg | ORAL_TABLET | Freq: Three times a day (TID) | ORAL | Status: DC | PRN

## 2017-02-17 MED ORDER — DOXEPIN HCL 25 MG PO CAPS
50.0000 mg | ORAL_CAPSULE | Freq: Every day | ORAL | Status: DC
  Administered 2017-02-17 – 2017-02-18 (×2): 50 mg via ORAL
  Filled 2017-02-17: qty 1
  Filled 2017-02-17: qty 2
  Filled 2017-02-17: qty 1
  Filled 2017-02-17: qty 2

## 2017-02-17 MED ORDER — HYDROCODONE-ACETAMINOPHEN 5-325 MG PO TABS
1.0000 | ORAL_TABLET | Freq: Four times a day (QID) | ORAL | Status: DC | PRN
  Administered 2017-02-17: 1 via ORAL
  Filled 2017-02-17: qty 1

## 2017-02-17 MED ORDER — ACETAMINOPHEN 325 MG PO TABS: 650.0000 mg | ORAL_TABLET | ORAL | Status: DC | PRN

## 2017-02-17 NOTE — Telephone Encounter (Signed)
I got a call from [place].  The patient was sent over for evaluation of new onset double vision.  The onset of the symptoms began sometime in mid November 2018.  The MRI of the brain however shows what looks like a more acute stroke in the right frontal area that is of moderate size.  This appears to be potentially an embolic stroke.  This stroke visualized generally would not result in double vision.  This stroke may have occurred since she was seen in the office.  The patient is to go to the emergency room for a stroke evaluation.  [facility] will tell her to do this.

## 2017-02-17 NOTE — ED Notes (Signed)
Patient transported to X-ray 

## 2017-02-17 NOTE — ED Notes (Signed)
IV Team at the bedside. 

## 2017-02-17 NOTE — H&P (Signed)
History and Physical    Kristin Mendez WKM:[id number] DOB: [birth year] DOA: 02/17/2017  Referring MD/NP/PA: Jannifer Franklin PCP: Cyndy Freeze, MD Outpatient Specialists: Jannifer Franklin, hematology in [place] Patient coming from: [facility]  Chief Complaint: MRI showed stroke  HPI: Kristin Mendez is a 76 y.o. female with medical history significant of chronic daily headaches, HTN, HLD.  Around Thanksgiving, she developed several episode of double vision with the longest lasting 1 hour.  About 1-2 weeks ago husband noticed some new memory issues and she was acting "different".  No physical issues, just personality changes.  She saw her neurologist, Dr. Jannifer Franklin on 12/05 who ordered an MRI.  MRI was done today and report not final but per imaging show a CVA, acute in nature and moderate size.  Per Dr. Jannifer Franklin, does not appear to be what was causing her double vision.  She was sent to the ER for evaluation.   In the ER, labs were pending. Neurology was consulted and hospitalist called for admission.   Review of Systems: all systems reviewed, negative unless stated above in HPI   Past Medical History:  Diagnosis Date  . Abnormality of gait 07/10/2015  . Ankle fracture, left   . Arthritis   . Cervical spondylosis without myelopathy 02/08/2013  . Dyslipidemia   . Headache(784.0)    Chronic daily  . Hypertension   . Myelodysplasia   . Obesity   . Occipital neuralgia    Right  . Peptic ulcer disease   . Vitamin D deficiency     Past Surgical History:  Procedure Laterality Date  . ABDOMINAL HYSTERECTOMY    . ANKLE FRACTURE SURGERY Left   . APPENDECTOMY    . BREAST LUMPECTOMY    . CATARACT EXTRACTION, BILATERAL    . DILATION AND CURETTAGE OF UTERUS    . LUMBAR LAMINECTOMY     Upper pending  . LUMBAR SPINE SURGERY     5 on occasions  . ROTATOR CUFF REPAIR Left   . THORACIC LAMINECTOMY  9/13  . TONSILLECTOMY    . TUBAL LIGATION Bilateral      reports that  has never smoked. she has  never used smokeless tobacco. She reports that she does not drink alcohol or use drugs.  Allergies  Allergen Reactions  . Codeine   . Lyrica [Pregabalin]   . Novocain [Procaine Hcl]     Family History  Problem Relation Age of Onset  . Alzheimer's disease Mother   . Heart failure Mother   . Heart attack Father   . Diabetes Brother   . Bipolar disorder Brother   . Stroke Maternal Grandfather      Prior to Admission medications   Medication Sig Start Date End Date Taking? Authorizing Provider  alosetron (LOTRONEX) 0.5 MG tablet Take 0.5 mg by mouth 2 (two) times daily as needed (for IBS symptoms).    Yes [provider]  ALPRAZolam Duanne Moron) 0.5 MG tablet Take 2 tablets approximately 45 minutes prior to the MRI study, take a third tablet if needed. Patient taking differently: Take 2 tablets (1 mg) approximately 45 minutes prior to the MRI study, take a third tablet if needed 02/03/17  Yes Kathrynn Ducking, MD  butorphanol (STADOL) 10 MG/ML nasal spray USE ONE DOSE EVERY 4 HOURS AS NEEDED FOR HEADACHE 11/25/16  Yes Kathrynn Ducking, MD  doxepin (SINEQUAN) 25 MG capsule Take 50 mg by mouth at bedtime.    Yes [provider]  HYDROcodone-acetaminophen (NORCO/VICODIN) 5-325 MG per tablet  Take 1 tablet by mouth every 6 (six) hours as needed for moderate pain.   Yes [provider]  omeprazole (PRILOSEC) 40 MG capsule Take 40 mg by mouth 2 (two) times daily. 12/01/16  Yes [provider]  triamcinolone (NASACORT ALLERGY 24HR) 55 MCG/ACT AERO nasal inhaler Place 2 sprays into the nose daily as needed (for allergic symptoms).   Yes [provider]  butorphanol (STADOL) 10 MG/ML nasal spray USE ONE DOSE EVERY 4 HOURS AS NEEDED FOR HEADACHE Patient not taking: Reported on 02/17/2017 11/25/16   Kathrynn Ducking, MD  Erenumab-aooe (AIMOVIG 140 DOSE) 70 MG/ML SOAJ Inject 140 mg into the skin every 30 (thirty) days. 02/03/17   Kathrynn Ducking, MD  NARCAN 4  MG/0.1ML LIQD nasal spray kit as directed. 12/03/16   [provider]  nitroGLYCERIN (NITROSTAT) 0.4 MG SL tablet Place 0.4 mg under the tongue every 5 (five) minutes as needed for chest pain.    [provider]  propranolol (INDERAL) 40 MG tablet Take 40 mg by mouth 2 (two) times daily.    [provider]  rosuvastatin (CRESTOR) 10 MG tablet Take 10 mg by mouth daily.    [provider]  tiZANidine (ZANAFLEX) 4 MG tablet Take 4 mg by mouth 3 (three) times daily.     [provider]  vitamin B-12 (CYANOCOBALAMIN) 1000 MCG tablet Take 1,000 mcg by mouth daily.    [provider]    Physical Exam: Vitals:   02/17/17 1421 02/17/17 1445 02/17/17 1546 02/17/17 1600  BP: 120/81 133/68 (!) 145/57 105/65  Pulse: 73 72 75 81  Resp: 18 (!) 22 17 17   TempSrc: Oral     SpO2: 100% 100% 100% 98%  Weight: 59 kg (130 lb)     Height: 4' 9"  (1.448 m)         Constitutional: NAD, calm, comfortable Vitals:   02/17/17 1421 02/17/17 1445 02/17/17 1546 02/17/17 1600  BP: 120/81 133/68 (!) 145/57 105/65  Pulse: 73 72 75 81  Resp: 18 (!) 22 17 17   TempSrc: Oral     SpO2: 100% 100% 100% 98%  Weight: 59 kg (130 lb)     Height: 4' 9"  (1.448 m)      Eyes: PERRL, lids and conjunctivae normal ENMT: Mucous membranes are moist. Posterior pharynx clear of any exudate or lesions.Normal dentition.  Neck: normal, supple, no masses, no thyromegaly Respiratory: clear to auscultation bilaterally, no wheezing, no crackles. Normal respiratory effort. No accessory muscle use.  Cardiovascular: Regular rate and rhythm, no murmurs / rubs / gallops. No extremity edema. 2+ pedal pulses. No carotid bruits.  Abdomen: no tenderness, no masses palpated. No hepatosplenomegaly. Bowel sounds positive.  Musculoskeletal: no clubbing / cyanosis. No joint deformity upper and lower extremities. Good ROM, no contractures. Normal muscle tone.  Skin: no rashes, lesions, ulcers. No  induration Neurologic: CN 2-12 grossly intact. Sensation intact, DTR normal. Strength 5/5 in all 4.  Psychiatric: Normal judgment and insight. Alert and oriented x 3. Normal mood.    Labs on Admission: I have personally reviewed following labs and imaging studies  CBC: Recent Labs  Lab 02/17/17 1512  WBC 2.6*  NEUTROABS PENDING  HGB 9.9*  HCT 31.5*  MCV 101.9*  PLT PENDING   Basic Metabolic Panel: Recent Labs  Lab 02/17/17 1512  NA 137  K 3.4*  CL 106  CO2 25  GLUCOSE 113*  BUN 9  CREATININE 0.64  CALCIUM 9.2   GFR: Estimated  Creatinine Clearance: 42.8 mL/min (by C-G formula based on SCr of 0.64 mg/dL). Liver Function Tests: Recent Labs  Lab 02/17/17 1512  AST 15  ALT 11*  ALKPHOS 57  BILITOT 1.6*  PROT 6.5  ALBUMIN 4.4   No results for input(s): LIPASE, AMYLASE in the last 168 hours. No results for input(s): AMMONIA in the last 168 hours. Coagulation Profile: Recent Labs  Lab 02/17/17 1512  INR 1.01   Cardiac Enzymes: No results for input(s): CKTOTAL, CKMB, CKMBINDEX, TROPONINI in the last 168 hours. BNP (last 3 results) No results for input(s): PROBNP in the last 8760 hours. HbA1C: No results for input(s): HGBA1C in the last 72 hours. CBG: No results for input(s): GLUCAP in the last 168 hours. Lipid Profile: No results for input(s): CHOL, HDL, LDLCALC, TRIG, CHOLHDL, LDLDIRECT in the last 72 hours. Thyroid Function Tests: No results for input(s): TSH, T4TOTAL, FREET4, T3FREE, THYROIDAB in the last 72 hours. Anemia Panel: No results for input(s): VITAMINB12, FOLATE, FERRITIN, TIBC, IRON, RETICCTPCT in the last 72 hours. Urine analysis:    Component Value Date/Time   COLORURINE YELLOW 02/17/2017 1429   APPEARANCEUR CLEAR 02/17/2017 1429   LABSPEC 1.006 02/17/2017 1429   PHURINE 7.0 02/17/2017 1429   GLUCOSEU NEGATIVE 02/17/2017 1429   HGBUR NEGATIVE 02/17/2017 1429   BILIRUBINUR NEGATIVE 02/17/2017 1429   KETONESUR NEGATIVE 02/17/2017 1429     PROTEINUR NEGATIVE 02/17/2017 1429   NITRITE NEGATIVE 02/17/2017 1429   LEUKOCYTESUR NEGATIVE 02/17/2017 1429   Sepsis Labs: Invalid input(s): PROCALCITONIN, LACTICIDVEN No results found for this or any previous visit (from the past 240 hour(s)).   Radiological Exams on Admission: No results found.  EKG: Independently reviewed. ordered  Assessment/Plan Active Problems:   Intractable migraine without aura   Diplopia   CVA (cerebral vascular accident) ([place])    CVA -? If double vision were TIAs -tele -MRA -?CTA vs carotid duplex -echo -FLP/HgbA1c -on statin prior to admission, will continue -ASA - not on prior to admission -permissive HTN  H/o migraines -usually take stadol daily for HA, has been doing this daily for a long time -defer to neuro if we can continue  Diplopia -? If were TIAs -had 3 episode with the longest lasting 1 hour  MDS -gets weekly procrit in [place]   DVT prophylaxis: lovenox Code Status: full Family Communication: husband at bedside Disposition Plan: pending work up-- suspect home as no movement issues Consults called: Neuro Admission status: inpt tele   Geradine Girt DO [facility] Pager [phone]  If 7PM-7AM, please contact night-coverage [email or web address] Password [email or web address]  02/17/2017, 4:29 PM

## 2017-02-17 NOTE — ED Notes (Signed)
MD Long at the bedside

## 2017-02-17 NOTE — ED Triage Notes (Addendum)
Per PT[facility], Pt is coming from Neurology where she was sent over after she had a MRI completed. Stated that she was noted to have a bleed on the MRI per her neurologist and they sent her over here to be evaluated. Before the scan, pt was complaining of some double vision x 1 month and a migraine.

## 2017-02-17 NOTE — Consult Note (Signed)
Referring Physician: Dr. Eliseo Squires    Chief Complaint: Subacute right anterior frontal lobe ischemic infarction  HPI: Kristin Mendez is an 76 y.o. female  with a past medical history of hypertension, hyperlipidemia, and myelodysplasia presenting to the ED for evaluation of an abnormal MRI.  Patient was seen by neurology on an outpatient basis on February 03, 2017 for chronic daily headaches and new complaint of diplopia.  An MRI was ordered at that time. MRI was done today and revealed a subacute large right anterior frontal lobe infarct.   Patient reports having headaches for several years and intermittent diplopia in her L eye only for the past 1 month. Reports having 3 episodes of diplopia in the past month, longest episode lasted an hour. Headaches are right sided frontal and associated with pain her right eye plus blurry vision. States she has been having intermittent difficulty getting words out for the past 6 months. Denies having any focal weakness, numbness, tingling, or gait problems. Denies having any new symptoms since her recent neurology appointment. Husband at bedside believes patient's personality has changed and she has been more confused lately. He is not able to elaborate any further on the personality change. Patient is currently using Stadol for her chronic headaches. She has not started using Aimovig yet. States is being followed by oncology and her Procrit dose was increased a month ago before her symptoms started.    Past Medical History:  Diagnosis Date  . Abnormality of gait 07/10/2015  . Ankle fracture, left   . Arthritis   . Cervical spondylosis without myelopathy 02/08/2013  . Dyslipidemia   . Headache(784.0)    Chronic daily  . Hypertension   . Myelodysplasia   . Obesity   . Occipital neuralgia    Right  . Peptic ulcer disease   . Vitamin D deficiency     Past Surgical History:  Procedure Laterality Date  . ABDOMINAL HYSTERECTOMY    . ANKLE FRACTURE SURGERY Left    . APPENDECTOMY    . BREAST LUMPECTOMY    . CATARACT EXTRACTION, BILATERAL    . DILATION AND CURETTAGE OF UTERUS    . LUMBAR LAMINECTOMY     Upper pending  . LUMBAR SPINE SURGERY     5 on occasions  . ROTATOR CUFF REPAIR Left   . THORACIC LAMINECTOMY  9/13  . TONSILLECTOMY    . TUBAL LIGATION Bilateral     Family History  Problem Relation Age of Onset  . Alzheimer's disease Mother   . Heart failure Mother   . Heart attack Father   . Diabetes Brother   . Bipolar disorder Brother   . Stroke Maternal Grandfather    Social History:  reports that  has never smoked. she has never used smokeless tobacco. She reports that she does not drink alcohol or use drugs.  Allergies:  Allergies  Allergen Reactions  . Codeine   . Lyrica [Pregabalin]   . Novocain [Procaine Hcl]     Medications:  Prior to Admission:  Medications Prior to Admission  Medication Sig Dispense Refill Last Dose  . alosetron (LOTRONEX) 0.5 MG tablet Take 0.5 mg by mouth 2 (two) times daily as needed (for IBS symptoms).    02/16/2017 at Unknown time  . ALPRAZolam (XANAX) 0.5 MG tablet Take 2 tablets approximately 45 minutes prior to the MRI study, take a third tablet if needed. (Patient taking differently: Take 2 tablets (1 mg) approximately 45 minutes prior to the MRI study, take a  third tablet if needed) 3 tablet 0 MRI at MRI  . butorphanol (STADOL) 10 MG/ML nasal spray USE ONE DOSE EVERY 4 HOURS AS NEEDED FOR HEADACHE 2.5 mL 5 Past Week at Unknown time  . diphenhydrAMINE (BENADRYL) 25 MG tablet Take 25 mg by mouth every 6 (six) hours as needed for allergies.   PRN at PRN  . doxepin (SINEQUAN) 25 MG capsule Take 50 mg by mouth at bedtime.    02/16/2017 at pm  . HYDROcodone-acetaminophen (NORCO/VICODIN) 5-325 MG per tablet Take 1 tablet by mouth every 6 (six) hours as needed for moderate pain.   02/16/2017 at pm  . NARCAN 4 MG/0.1ML LIQD nasal spray kit Place 1 spray into the nose once as needed (AS DIRECTED).    PRN  at PRN  . nitroGLYCERIN (NITROSTAT) 0.4 MG SL tablet Place 0.4 mg under the tongue every 5 (five) minutes as needed for chest pain.   Past Week at Unknown time  . omeprazole (PRILOSEC) 40 MG capsule Take 40 mg by mouth 2 (two) times daily.   02/17/2017 at am  . propranolol (INDERAL) 40 MG tablet Take 40 mg by mouth 2 (two) times daily.   02/17/2017 at am  . rosuvastatin (CRESTOR) 10 MG tablet Take 10 mg by mouth daily.   02/16/2017 at pm  . tiZANidine (ZANAFLEX) 4 MG tablet Take 4 mg by mouth 3 (three) times daily.    02/17/2017 at am  . triamcinolone (NASACORT ALLERGY 24HR) 55 MCG/ACT AERO nasal inhaler Place 2 sprays into the nose daily as needed (for allergic symptoms).   PRN at PRN  . vitamin B-12 (CYANOCOBALAMIN) 1000 MCG tablet Take 1,000 mcg by mouth daily.   Past Week at Unknown time  . butorphanol (STADOL) 10 MG/ML nasal spray USE ONE DOSE EVERY 4 HOURS AS NEEDED FOR HEADACHE (Patient not taking: Reported on 02/17/2017) 12.5 mL 5 Not Taking at Unknown time  . Erenumab-aooe (AIMOVIG 140 DOSE) 70 MG/ML SOAJ Inject 140 mg into the skin every 30 (thirty) days. 2 pen 3 Not yet at Not yet    ROS: Pertinent positives mentioned in HPI. Remainder of all ROS negative.   Physical Examination: Blood pressure 133/68, pulse 72, resp. rate (!) 22, height 4' 9"  (1.448 m), weight 130 lb (59 kg), SpO2 100 %. Physical Exam  Constitutional: She appears well-developed and well-nourished. No distress.  HENT:  Head: Normocephalic and atraumatic.  Mouth/Throat: Oropharynx is clear and moist.  Eyes: Right eye exhibits no discharge. Left eye exhibits no discharge.  Cardiovascular: Normal rate, regular rhythm and intact distal pulses.  Pulmonary/Chest: Effort normal and breath sounds normal. No respiratory distress. She has no wheezes. She has no rales.  Abdominal: Soft. Bowel sounds are normal. She exhibits no distension. There is no tenderness.  Musculoskeletal: She exhibits no edema.  Skin: Skin is warm  and dry. No rash noted. No erythema.  Psychiatric: She has a normal mood and affect. Her behavior is normal.   Neurologic Examination: Mental Status: Alert, oriented x 3, thought content appropriate.  Speech fluent without evidence of aphasia.  Able to follow commands without difficulty. Has difficulty interpreting abstract phrases. Fails a simple go-no-go test. Mild impulsivity in responding when asked to follow a series of 3-step directional commands.  Cranial Nerves: II: Visual fields grossly normal,  III,IV, VI: ptosis not present, extra-ocular motions intact bilaterally pupils equal, round, reactive to light V,VII: smile symmetric, facial light touch sensation normal bilaterally VIII: hearing grossly normal bilaterally IX,X: uvula rises  symmetrically XI: bilateral shoulder shrug XII: midline tongue extension Motor: Right :  Upper extremity   5/5                                      Left:     Upper extremity   5/5             Lower extremity   5/5                                                  Lower extremity   5/5 Tone and bulk:normal tone throughout; no atrophy noted Sensory: Pinprick and light touch intact throughout, bilaterally Deep Tendon Reflexes: 1+ in bilateral upper extremities and 0 in bilateral lower extremities  Plantars: Right: downgoing                 Left: downgoing Cerebellar: Normal finger-to-nose, normal rapid alternating movements and normal heel-to-shin test  Results for orders placed or performed during the hospital encounter of 02/17/17 (from the past 48 hour(s))  Urinalysis, Routine w reflex microscopic     Status: None   Collection Time: 02/17/17  2:29 PM  Result Value Ref Range   Color, Urine YELLOW YELLOW   APPearance CLEAR CLEAR   Specific Gravity, Urine 1.006 1.005 - 1.030   pH 7.0 5.0 - 8.0   Glucose, UA NEGATIVE NEGATIVE mg/dL   Hgb urine dipstick NEGATIVE NEGATIVE   Bilirubin Urine NEGATIVE NEGATIVE   Ketones, ur NEGATIVE NEGATIVE mg/dL    Protein, ur NEGATIVE NEGATIVE mg/dL   Nitrite NEGATIVE NEGATIVE   Leukocytes, UA NEGATIVE NEGATIVE   No results found.  Assessment: 76 y.o. female with a past medical history of hypertension, hyperlipidemia, and myelodysplasia presenting with chronic headaches and a month history of intermittent diplopia in her left eye. She reports having word-finding difficulty for the past 6 months and has been confused lately per family. Imaging showing a subacute large right anterior frontal lobe infarct. The lack of motor deficits and aphasia could be explained by the location of the infarct. Subtle impairment of frontal executive function noted on exam.  Stroke Risk Factors - family history, hyperlipidemia and hypertension  Plan: Subacute ischemic stroke: 1. MRA head w/o contrast  2. HgbA1c, fasting lipid panel 3. PT consult, OT consult, Speech consult 4. Echocardiogram 5. Carotid dopplers 6. Prophylactic therapy-Antiplatelet med: Aspirin - dose 325 mg daily  7. Risk factor modification 8. Telemetry monitoring 9. Frequent neuro checks 10. Statin  Chronic headaches associated with blurry vision:  Hx of migraine as well as rebound type headaches.  Sed rate normal on recent labs.  Continue home medication Stadol   Shela Leff, MD PGY3 - IMTS  I have seen and examined the patient. I have discussed the assessment and plan with Dr. Marlowe Sax.  @Electronically  signed: Dr. Kerney Elbe 02/17/2017, 3:48 PM

## 2017-02-17 NOTE — ED Notes (Signed)
Attempted to start IV x1 Unsuccessful. Pt states she is typically a difficult IV start.

## 2017-02-17 NOTE — ED Notes (Signed)
Admitting Team at the bedside 

## 2017-02-17 NOTE — ED Provider Notes (Signed)
Emergency Department Provider Note   I have reviewed the triage vital signs and the nursing notes.   HISTORY  Chief Complaint Migraine   HPI Kristin Mendez is a 76 y.o. female with PMH of HLD, HTN, and Myelodysplasia to the emergency department for evaluation of abnormal MRI.  The patient is followed by her outpatient neurologist for double vision over the last month.  She has had some associated headache but no other neurological deficits.  She went for an outpatient MRI today which showed concern for acute infarct in the right frontal lobe.  Patient denies any sudden worsening symptoms, speech changes, difficulty swallowing.  She does have history of esophageal disease but describes this as at baseline.  No weakness or numbness in the arms or legs.  No difficulty walking. No history of prior CVA. Patient was sent to the ED by her Neurologist for a CVA workup.    Past Medical History:  Diagnosis Date  . Abnormality of gait 07/10/2015  . Ankle fracture, left   . Arthritis   . Cervical spondylosis without myelopathy 02/08/2013  . Dyslipidemia   . Headache(784.0)    Chronic daily  . Hypertension   . Myelodysplasia   . Obesity   . Occipital neuralgia    Right  . Peptic ulcer disease   . Vitamin D deficiency     Patient Active Problem List   Diagnosis Date Noted  . CVA (cerebral vascular accident) ([place]) 02/17/2017  . Diplopia 02/03/2017  . Abnormality of gait 07/10/2015  . Cervical spondylosis without myelopathy 02/08/2013  . Neuralgia, neuritis, and radiculitis, unspecified 10/22/2011  . Intractable migraine without aura 10/22/2011    Past Surgical History:  Procedure Laterality Date  . ABDOMINAL HYSTERECTOMY    . ANKLE FRACTURE SURGERY Left   . APPENDECTOMY    . BREAST LUMPECTOMY    . CATARACT EXTRACTION, BILATERAL    . DILATION AND CURETTAGE OF UTERUS    . LUMBAR LAMINECTOMY     Upper pending  . LUMBAR SPINE SURGERY     5 on occasions  . ROTATOR CUFF REPAIR  Left   . THORACIC LAMINECTOMY  9/13  . TONSILLECTOMY    . TUBAL LIGATION Bilateral     Current Outpatient Rx  . Order #: [id number] Class: Historical Med  . Order #: [id number] Class: Print  . Order #: [id number] Class: Print  . Order #: [id number] Class: Historical Med  . Order #: [id number] Class: Historical Med  . Order #: [id number] Class: Historical Med  . Order #: [id number] Class: Historical Med  . Order #: [id number] Class: Historical Med  . Order #: [id number] Class: Historical Med  . Order #: [id number] Class: Historical Med  . Order #: [id number] Class: Historical Med  . Order #: [id number] Class: Historical Med  . Order #: [id number] Class: Historical Med  . Order #: [id number] Class: Historical Med  . Order #: [id number] Class: Print  . Order #: [id number] Class: Normal    Allergies Novocain [procaine hcl]; Codeine; and Lyrica [pregabalin]  Family History  Problem Relation Age of Onset  . Alzheimer's disease Mother   . Heart failure Mother   . Heart attack Father   . Diabetes Brother   . Bipolar disorder Brother   . Stroke Maternal Grandfather     Social History Social History   Tobacco Use  . Smoking status: Never Smoker  . Smokeless tobacco: Never Used  Substance Use Topics  . Alcohol use: No  . Drug use: No    Review of Systems  Constitutional: No fever/chills Eyes: No visual  changes. ENT: No sore throat. Cardiovascular: Denies chest pain. Respiratory: Denies shortness of breath. Gastrointestinal: No abdominal pain.  No nausea, no vomiting.  No diarrhea.  No constipation. Genitourinary: Negative for dysuria. Musculoskeletal: Negative for back pain. Skin: Negative for rash. Neurological: Negative for focal weakness or numbness. Positive double vision. Positive HA.   10-point ROS otherwise negative.  ____________________________________________   PHYSICAL EXAM:  VITAL SIGNS: ED Triage Vitals [02/17/17 1421]  Enc Vitals Group     BP 120/81     Pulse Rate 73      Resp 18     Temp      Temp Source Oral     SpO2 100 %     Weight 130 lb (59 kg)     Height 4\' 9"  (1.448 m)     Pain Score 8   Constitutional: Alert and oriented. Well appearing and in no acute distress. Eyes: Conjunctivae are normal. Head: Atraumatic. Nose: No congestion/rhinnorhea. Mouth/Throat: Mucous membranes are moist.  Neck: No stridor.   Cardiovascular: Normal rate, regular rhythm. Good peripheral circulation. Grossly normal heart sounds.   Respiratory: Normal respiratory effort.  No retractions. Lungs CTAB. Gastrointestinal: Soft and nontender. No distention.  Musculoskeletal: No lower extremity tenderness nor edema. No gross deformities of extremities. Neurologic:  Normal speech and language. No gross focal neurologic deficits are appreciated. Normal CN exam 2-12. No pronator drift. Skin:  Skin is warm, dry and intact. No rash noted.  ____________________________________________   LABS (all labs ordered are listed, but only abnormal results are displayed)  Labs Reviewed  CBC - Abnormal; Notable for the following components:      Result Value   WBC 2.6 (*)    RBC 3.09 (*)    Hemoglobin 9.9 (*)    HCT 31.5 (*)    MCV 101.9 (*)    RDW 21.5 (*)    Platelets 75 (*)    All other components within normal limits  DIFFERENTIAL - Abnormal; Notable for the following components:   Neutro Abs 0.9 (*)    All other components within normal limits  COMPREHENSIVE METABOLIC PANEL - Abnormal; Notable for the following components:   Potassium 3.4 (*)    Glucose, Bld 113 (*)    ALT 11 (*)    Total Bilirubin 1.6 (*)    All other components within normal limits  RAPID URINE DRUG SCREEN, HOSP PERFORMED - Abnormal; Notable for the following components:   Opiates POSITIVE (*)    Benzodiazepines POSITIVE (*)    All other components within normal limits  ETHANOL  PROTIME-INR  APTT  URINALYSIS, ROUTINE W REFLEX MICROSCOPIC  TROPONIN I    ____________________________________________  EKG   EKG Interpretation  Date/Time:  Wednesday February 17 2017 15:17:32 EST Ventricular Rate:  78 PR Interval:    QRS Duration: 82 QT Interval:  400 QTC Calculation: 456 R Axis:   47 Text Interpretation:  Sinus rhythm No STEMI.  Confirmed by Nanda Quinton [phone]) on 02/17/2017 6:36:33 PM       ____________________________________________  RADIOLOGY  Outpatient MRI reviewed with right frontal subacute infarct.  ____________________________________________   PROCEDURES  Procedure(s) performed:   Procedures  None ____________________________________________   INITIAL IMPRESSION / ASSESSMENT AND PLAN / ED COURSE  Pertinent labs & imaging results that were available during my care of the patient were reviewed by me and considered in my medical decision making (see chart for details).  Patient presents to the emergency department for evaluation of stroke found on outpatient MRI.  She been experiencing double vision and headache over the last month.  No worsening symptoms.  Neurologist sent her to the emergency department for stroke/embolic workup.  I reviewed the MRI results and will discuss with neurology.  03:15 PM Spoke with Dr. Cheral Marker who will see and consult on the patient in the ED. Recommends admission for subacute CVA.   Discussed patient's case with Hospitalist to request admission. Patient and family (if present) updated with plan. Care transferred to Hospitalist service.  I reviewed all nursing notes, vitals, pertinent old records, EKGs, labs, imaging (as available).  ____________________________________________  FINAL CLINICAL IMPRESSION(S) / ED DIAGNOSES  Final diagnoses:  Cerebrovascular accident (CVA), unspecified mechanism ([place])    Note:  This document was prepared using Dragon voice recognition software and may include unintentional dictation errors.  Nanda Quinton, MD Emergency Medicine     Long, [facility], MD 02/17/17 [phone]

## 2017-02-18 ENCOUNTER — Inpatient Hospital Stay (HOSPITAL_COMMUNITY): Payer: Medicare Other

## 2017-02-18 ENCOUNTER — Encounter (HOSPITAL_COMMUNITY): Payer: Self-pay | Admitting: Radiology

## 2017-02-18 ENCOUNTER — Telehealth: Payer: Self-pay | Admitting: Neurology

## 2017-02-18 DIAGNOSIS — I361 Nonrheumatic tricuspid (valve) insufficiency: Secondary | ICD-10-CM

## 2017-02-18 DIAGNOSIS — I63411 Cerebral infarction due to embolism of right middle cerebral artery: Secondary | ICD-10-CM

## 2017-02-18 DIAGNOSIS — D469 Myelodysplastic syndrome, unspecified: Secondary | ICD-10-CM

## 2017-02-18 DIAGNOSIS — I639 Cerebral infarction, unspecified: Secondary | ICD-10-CM

## 2017-02-18 DIAGNOSIS — K219 Gastro-esophageal reflux disease without esophagitis: Secondary | ICD-10-CM

## 2017-02-18 LAB — BASIC METABOLIC PANEL
Anion gap: 6 (ref 5–15)
BUN: 7 mg/dL (ref 6–20)
CHLORIDE: 108 mmol/L (ref 101–111)
CO2: 26 mmol/L (ref 22–32)
Calcium: 9 mg/dL (ref 8.9–10.3)
Creatinine, Ser: 0.68 mg/dL (ref 0.44–1.00)
GFR calc Af Amer: >60 mL/min (ref >60)
GFR calc non Af Amer: >60 mL/min (ref >60)
GLUCOSE: 153 mg/dL — AB (ref 65–99)
POTASSIUM: 3.3 mmol/L — AB (ref 3.5–5.1)
Sodium: 140 mmol/L (ref 135–145)

## 2017-02-18 LAB — CBC
HCT: 27.8 % — ABNORMAL LOW (ref 36.0–46.0)
HEMOGLOBIN: 8.8 g/dL — AB (ref 12.0–15.0)
MCH: 32 pg (ref 26.0–34.0)
MCHC: 31.7 g/dL (ref 30.0–36.0)
MCV: 101.1 fL — AB (ref 78.0–100.0)
Platelets: 74 10*3/uL — ABNORMAL LOW (ref 150–400)
RBC: 2.75 MIL/uL — AB (ref 3.87–5.11)
RDW: 21.4 % — ABNORMAL HIGH (ref 11.5–15.5)
WBC: 3.1 10*3/uL — ABNORMAL LOW (ref 4.0–10.5)

## 2017-02-18 LAB — HEMOGLOBIN A1C
Hgb A1c MFr Bld: 5.1 % (ref 4.8–5.6)
MEAN PLASMA GLUCOSE: 99.67 mg/dL

## 2017-02-18 LAB — LIPID PANEL
CHOL/HDL RATIO: 2.7 ratio
CHOLESTEROL: 105 mg/dL (ref 0–200)
HDL: 39 mg/dL — AB (ref >40)
LDL Cholesterol: 52 mg/dL (ref 0–99)
Triglycerides: 72 mg/dL (ref <150)
VLDL: 14 mg/dL (ref 0–40)

## 2017-02-18 LAB — ECHOCARDIOGRAM COMPLETE
Height: 57 in
Weight: 2080 oz

## 2017-02-18 MED ORDER — IOPAMIDOL (ISOVUE-370) INJECTION 76%
INTRAVENOUS | Status: AC
  Administered 2017-02-18: 50 mL
  Filled 2017-02-18: qty 50

## 2017-02-18 NOTE — Progress Notes (Signed)
Occupational Therapy Evaluation Patient Details Name: Kristin Mendez MRN: [id number] DOB: 01-29-1941 Today's Date: 02/18/2017    History of Present Illness 76 y.o. female with PMH of hypertension, hyperlipidemia, and myelodysplasia presenting with chronic headaches and a month history of intermittent diplopia. MRI + subacute large right anterior frontal lobe infarct.   Clinical Impression   PTA, pt lived at home with husband and was independent with ADL, IADL tasks and mobility. Pt presents with apparent cognitive deficits and mild L inattention, affecting her ability to complete IADL tasks and leisure activities. At this time, recommend pt follow up with OT at a neuro outpt center and refrain from driving. Will follow acutely to facilitate safe DC home with S of husband.     Follow Up Recommendations  Outpatient OT;Supervision - Intermittent(neuro outpt)    Equipment Recommendations  None recommended by OT    Recommendations for Other Services       Precautions / Restrictions        Mobility Bed Mobility Overal bed mobility: Modified Independent                Transfers Overall transfer level: Modified independent                    Balance Overall balance assessment: No apparent balance deficits (not formally assessed)                                         ADL either performed or assessed with clinical judgement   ADL Overall ADL's : Needs assistance/impaired                                     Functional mobility during ADLs: Supervision/safety General ADL Comments: Pt able to complete basic ADL tasks; apparent difficulty with medication management; financial management     Vision Patient Visual Report: Diplopia(resolved)       Perception Perception Perception Tested?: Yes Perception Deficits: Inattention/neglect(mild L inattention; identified 2/5 targets on L during simul) Spatial deficits: difficulty managing  in small space - decreased awareness of therapist being on her left Comments: will further assess   Praxis Praxis Praxis-Other Comments: will assess    Pertinent Vitals/Pain Pain Assessment: No/denies pain     Hand Dominance Right   Extremity/Trunk Assessment Upper Extremity Assessment Upper Extremity Assessment: LUE deficits/detail LUE Deficits / Details: LUE functional; mild weakness noted   Lower Extremity Assessment Lower Extremity Assessment: Defer to PT evaluation   Cervical / Trunk Assessment Cervical / Trunk Assessment: Other exceptions(multiple back surgeries)   Communication Communication Communication: Expressive difficulties(word finding difficulties at times)   Cognition   Behavior During Therapy: Flat affect Overall Cognitive Status: Impaired/Different from baseline Area of Impairment: Attention;Memory;Awareness;Problem solving                   Current Attention Level: Sustained Memory: Decreased short-term memory     Awareness: Emergent Problem Solving: Slow processing;Difficulty sequencing;Requires verbal cues General Comments: Difficulty finding her room after walking; L inattention noted; pt states she has a harder time reading; husband staes she leaves things "undone, like wrapping packages"   General Comments       Exercises     Shoulder Instructions      Home Living Family/patient expects to be discharged to:: Private residence Living  Arrangements: Spouse/significant other Available Help at Discharge: Family Type of Home: House Home Access: Stairs to enter [employer] of Steps: 6 Entrance Stairs-Rails: Right Home Layout: Multi-level     Bathroom Shower/Tub: Occupational psychologist: Standard Bathroom Accessibility: Yes How Accessible: Accessible via walker Home Equipment: [place] - 2 wheels;Cane - single point;Crutches;Shower seat - built in;Bedside commode      Lives With: Spouse    Prior  Functioning/Environment Level of Independence: Independent        Comments: enjoys reading; drives occasionally        OT Problem List: Impaired vision/perception;Decreased cognition;Decreased safety awareness      OT Treatment/Interventions: Self-care/ADL training;Therapeutic activities;Cognitive remediation/compensation;Visual/perceptual remediation/compensation;Patient/family education    OT Goals(Current goals can be found in the care plan section) Acute Rehab OT Goals Patient Stated Goal: to go home OT Goal Formulation: With patient Time For Goal Achievement: 03/04/17 Potential to Achieve Goals: Good ADL Goals Additional ADL Goal #1: Pt will complete 3 step trail making task with min vc Additional ADL Goal #2: Family/pt will independently verbalize understanding of need to minimize distraction to improve pt's ability to attend to tasks  OT Frequency: Min 2X/week   Barriers to D/C:            Co-evaluation              AM-PAC PT "6 Clicks" Daily Activity     Outcome Measure Help from another person eating meals?: None Help from another person taking care of personal grooming?: None Help from another person toileting, which includes using toliet, bedpan, or urinal?: None Help from another person bathing (including washing, rinsing, drying)?: A Little Help from another person to put on and taking off regular upper body clothing?: None Help from another person to put on and taking off regular lower body clothing?: None 6 Click Score: 23   End of Session Equipment Utilized During Treatment: Gait belt Nurse Communication: Mobility status  Activity Tolerance: Patient tolerated treatment well Patient left: in bed;with call bell/phone within reach;with family/visitor present  OT Visit Diagnosis: Other symptoms and signs involving cognitive function                Time: 8527-7824 OT Time Calculation (min): 23 min Charges:  OT General Charges $OT Visit: 1 Visit OT  Evaluation $OT Eval Moderate Complexity: 1 Mod OT Treatments $Self Care/Home Management : 8-22 mins G-Codes:     [facility], OT/L  [phone] 02/18/2017  Kristin Mendez,Kristin Mendez 02/18/2017, 5:38 PM

## 2017-02-18 NOTE — Progress Notes (Signed)
    [facility] has been requested to perform a transesophageal echocardiogram on Kristin Mendez for stroke.  After careful review of history and examination, the risks and benefits of transesophageal echocardiogram have been explained including risks of esophageal damage, perforation (1:10,000 risk), bleeding, pharyngeal hematoma as well as other potential complications associated with conscious sedation including aspiration, arrhythmia, respiratory failure and death. Alternatives to treatment were discussed, questions were answered. Patient is willing to proceed.   Pt has history of esophageal stricture and multiple esophageal dilations. Staff message sent to Dr. Harrington Challenger.   Plan for TEE tomorrow with Dr. Harrington Challenger. Time unknown. NPO at MN tonight.   Tami Lin Nuria Phebus, [place]  02/18/2017 2:19 PM

## 2017-02-18 NOTE — Progress Notes (Signed)
PROGRESS NOTE    Kristin Mendez  GHW:[id number] DOB: [birth year] DOA: 02/17/2017 PCP: Cyndy Freeze, MD   Chief Complaint  Patient presents with  . Migraine    Brief Narrative:  HPI on 02/17/2017 by Dr. Eulogio Bear Kristin Mendez is a 76 y.o. female with medical history significant of chronic daily headaches, HTN, HLD.  Around Thanksgiving, she developed several episode of double vision with the longest lasting 1 hour.  About 1-2 weeks ago husband noticed some new memory issues and she was acting "different".  No physical issues, just personality changes.  She saw her neurologist, Dr. Jannifer Franklin on 12/05 who ordered an MRI.  MRI was done today and report not final but per imaging show a CVA, acute in nature and moderate size.  Per Dr. Jannifer Franklin, does not appear to be what was causing her double vision.  She was sent to the ER for evaluation.   In the ER, labs were pending. Neurology was consulted and hospitalist called for admission.  Assessment & Plan   Subacute CVA -MRI brain showed right posterior frontal mostly subacute infarct with a small acute hemorrhagic component. Surrounding mild cytotoxic edema but without significant midline shift. -MRA head showed right M3 occlusion, severe stenosis proximal vertebral arteries versus skull base artifact -Echocardiogram shows an EF of 78-93%, grade 1 diastolic dysfunction. No RWMA. -Lower extremity Doppler negative for DVT -LDL 52, hemoglobin A1c 5.1 -Neurology consulted and appreciated, recommended TEE -Cardiology consulted, TEE to be done on 02/19/2017 -Continue aspirin, statin  History of migraines -Uses Stadol and Aimovig for headaches.   Diplopia -Resolved, patient had episodes in the past but the longest episode lasting 1 hour  MDS -Receives weekly Procrit at [facility]  GERD -Continue PPI  DVT Prophylaxis  Lovenox  Code Status: Full  Family Communication: husband at beside  Disposition Plan: Admitted. Pending TEE on  02/19/2017  Consultants Neurology Cardiology  Procedures  Echocardiogram LE doppler  Antibiotics   Anti-infectives (From admission, onward)   None      Subjective:   Kristin Mendez seen and examined today.  Has no complaints this morning. Denies any current chest pain, shortness breath, dental pain, nausea,, diarrhea constipation, dizziness or headache or changes in vision.  Objective:   Vitals:   02/18/17 0000 02/18/17 0500 02/18/17 1032 02/18/17 1421  BP:  (!) 148/58 (!) 156/62 (!) 184/69  Pulse:  73 81 88  Resp: 20 19 16 17   Temp: 98.7 F (37.1 C) 98.7 F (37.1 C) 99 F (37.2 C) 98.6 F (37 C)  TempSrc: Oral Oral Oral Oral  SpO2: 99% 97% 97% 98%  Weight:      Height:        Intake/Output Summary (Last 24 hours) at 02/18/2017 1518 Last data filed at 02/18/2017 8101 Gross per 24 hour  Intake 120 ml  Output -  Net 120 ml   Filed Weights   02/17/17 1421  Weight: 59 kg (130 lb)    Exam  General: Well developed, well nourished, NAD, appears stated age  HEENT: NCAT, mucous membranes moist.   Cardiovascular: S1 S2 auscultated, no rubs, murmurs or gallops. Regular rate and rhythm.  Respiratory: Clear to auscultation bilaterally with equal chest rise  Abdomen: Soft, nontender, nondistended, + bowel sounds  Extremities: warm dry without cyanosis clubbing or edema  Neuro: AAOx3, nonfocal  Psych: Normal affect and demeanor with intact judgement and insight   Data Reviewed: I have personally reviewed following labs and imaging studies  CBC: Recent  Labs  Lab 02/17/17 1512 02/18/17 0453  WBC 2.6* 3.1*  NEUTROABS 0.9*  --   HGB 9.9* 8.8*  HCT 31.5* 27.8*  MCV 101.9* 101.1*  PLT 75* 74*   Basic Metabolic Panel: Recent Labs  Lab 02/17/17 1512 02/18/17 0453  NA 137 140  K 3.4* 3.3*  CL 106 108  CO2 25 26  GLUCOSE 113* 153*  BUN 9 7  CREATININE 0.64 0.68  CALCIUM 9.2 9.0   GFR: Estimated Creatinine Clearance: 42.8 mL/min (by C-G formula  based on SCr of 0.68 mg/dL). Liver Function Tests: Recent Labs  Lab 02/17/17 1512  AST 15  ALT 11*  ALKPHOS 57  BILITOT 1.6*  PROT 6.5  ALBUMIN 4.4   No results for input(s): LIPASE, AMYLASE in the last 168 hours. No results for input(s): AMMONIA in the last 168 hours. Coagulation Profile: Recent Labs  Lab 02/17/17 1512  INR 1.01   Cardiac Enzymes: No results for input(s): CKTOTAL, CKMB, CKMBINDEX, TROPONINI in the last 168 hours. BNP (last 3 results) No results for input(s): PROBNP in the last 8760 hours. HbA1C: Recent Labs    02/18/17 0453  HGBA1C 5.1   CBG: No results for input(s): GLUCAP in the last 168 hours. Lipid Profile: Recent Labs    02/18/17 0453  CHOL 105  HDL 39*  LDLCALC 52  TRIG 72  CHOLHDL 2.7   Thyroid Function Tests: No results for input(s): TSH, T4TOTAL, FREET4, T3FREE, THYROIDAB in the last 72 hours. Anemia Panel: No results for input(s): VITAMINB12, FOLATE, FERRITIN, TIBC, IRON, RETICCTPCT in the last 72 hours. Urine analysis:    Component Value Date/Time   COLORURINE YELLOW 02/17/2017 1429   APPEARANCEUR CLEAR 02/17/2017 1429   LABSPEC 1.006 02/17/2017 1429   PHURINE 7.0 02/17/2017 1429   GLUCOSEU NEGATIVE 02/17/2017 1429   HGBUR NEGATIVE 02/17/2017 1429   BILIRUBINUR NEGATIVE 02/17/2017 1429   KETONESUR NEGATIVE 02/17/2017 1429   PROTEINUR NEGATIVE 02/17/2017 1429   NITRITE NEGATIVE 02/17/2017 1429   LEUKOCYTESUR NEGATIVE 02/17/2017 1429   Sepsis Labs: @LABRCNTIP (procalcitonin:4,lacticidven:4)  )No results found for this or any previous visit (from the past 240 hour(s)).    Radiology Studies: Dg Chest 2 View  Result Date: 02/17/2017 CLINICAL DATA:  76 y/o  F; intracranial hemorrhage. EXAM: CHEST  2 VIEW COMPARISON:  03/26/2009 chest radiograph FINDINGS: Stable normal cardiac silhouette. Aortic atherosclerosis with calcification. Clear lungs. No pleural effusion or pneumothorax. No acute osseous abnormality is evident.  Stable severe discogenic degenerative changes and mild degenerative loss of height of lower thoracic vertebral bodies. IMPRESSION: No acute pulmonary process identified. Electronically Signed   By: Kristine Garbe M.D.   On: 02/17/2017 20:04   Mr Jodene Nam Head Wo Contrast  Addendum Date: 02/17/2017   ADDENDUM REPORT: 02/17/2017 19:41 ADDENDUM: Acute findings discussed with and reconfirmed by Dr.Nanavati on 02/17/2017 at 7:30 pm. Electronically Signed   By: Elon Alas M.D.   On: 02/17/2017 19:41   Result Date: 02/17/2017 CLINICAL DATA:  Diplopia for 2 months, headache, confusion and gait instability. EXAM: MRA HEAD WITHOUT CONTRAST TECHNIQUE: Angiographic images of the Circle of Willis were obtained using MRA technique without intravenous contrast. COMPARISON:  MRI of the head February 17, 2017 at 1213 hours FINDINGS: ANTERIOR CIRCULATION: Normal flow related enhancement of the included cervical, petrous, cavernous and supraclinoid internal carotid arteries. Patent anterior communicating artery. RIGHT anterior M3 occlusion. Otherwise patent anterior and middle cerebral arteries. No large vessel occlusion, flow limiting stenosis, aneurysm. POSTERIOR CIRCULATION: Codominant vertebral artery's with focal  stenosis bilateral vertebral artery's, at skull base. Basilar artery is patent, with normal flow related enhancement of the main branch vessels. Patent posterior cerebral arteries. Robust bilateral posterior communicating artery's. No large vessel occlusion, aneurysm. ANATOMIC VARIANTS: Hypoplastic bilateral P1 segment. Source images and MIP images were reviewed. IMPRESSION: 1. RIGHT M3 occlusion. 2. Severe stenosis proximal vertebral artery's versus skullbase artifact. Electronically Signed: By: Elon Alas M.D. On: 02/17/2017 19:18   Mr Brain Wo Contrast  Result Date: 02/17/2017  [facility] [address] [place], [place] [address] [phone] NEUROIMAGING  REPORT STUDY DATE: 02/17/2017 PATIENT NAME: Kristin Mendez DOB: 1940-11-11 MRN: [id number] ORDERING CLINICIAN: Dr Jannifer Franklin CLINICAL HISTORY: 10 year patient with headaches and diplopia COMPARISON FILMS:  none EXAM: MRI Brain wo TECHNIQUE: MRI of the brain without contrast was obtained utilizing 5 mm axial slices with T1, T2, T2 flair, T2 star gradient echo and diffusion weighted views.  T1 sagittal and T2 coronal views were obtained.  Study is slightly limited by motion artifacts CONTRAST: none IMAGING SITE: [place] FINDINGS: The brain parenchyma shows a large 4.8 x3 x 4.4 cm diffusion positive lesion in the right posterior frontal region with mixed subacute and a smaller acute infarct component.  The lesion is mostly not dark on the ADC map except for a small posterior medial portion.  There is also areas of microhemorrhages in the lesion.  There is mild surrounding cytotoxic edema but no significant effacement of the ventricle or midline shift is noted.  A smaller remote age infarct is noted in the right cerebellum.  There are mild changes of chronic microvascular ischemia.  There are mild changes of chronic microvascular ischemia.No abnormal lesions are seen on diffusion-weighted views to suggest acute ischemia. The cortical sulci, fissures and cisterns are normal in size and appearance. Lateral, third and fourth ventricle are normal in size and appearance. No extra-axial fluid collections are seen. No evidence of mass effect or midline shift.  On sagittal views the posterior fossa, pituitary gland and corpus callosum are unremarkable. No evidence of intracranial hemorrhage on gradient-echo views. The orbits and their contents, paranasal sinuses and calvarium are unremarkable.  Intracranial flow voids are present.   Abnormal MRI scan of the brain showing a large  Right posterior frontal mostly subacute infarct with a small acute and hemorrhagic component.  There is surrounding mild cytotoxic edema but  without significant midline shift.  There are mild changes of chronic microvascular ischemia and paranasal sinusitis INTERPRETING PHYSICIAN: PRAMOD SETHI, MD Certified in  Neuroimaging by [employer] and [employer]     Scheduled Meds: . aspirin EC  81 mg Oral Daily  . doxepin  50 mg Oral QHS  . enoxaparin (LOVENOX) injection  40 mg Subcutaneous Q24H  . feeding supplement (ENSURE ENLIVE)  237 mL Oral BID BM  . pantoprazole  80 mg Oral Daily  . propranolol  40 mg Oral BID  . rosuvastatin  10 mg Oral q1800  . vitamin B-12  1,000 mcg Oral Daily   Continuous Infusions:   LOS: 1 day   Time Spent in minutes   30 minutes  Priscillia Fouch D.O. on 02/18/2017 at 3:18 PM  Between 7am to 7pm - Pager - [phone]  After 7pm go to [email or web address] - password [email or web address]  And look for the night coverage person covering for me after hours  [facility] Office  [phone]

## 2017-02-18 NOTE — Evaluation (Signed)
Speech Language Pathology Evaluation Patient Details Name: Kristin Mendez MRN: [id number] DOB: [birth year] Today's Date: 02/18/2017 Time: 0750-0805 SLP Time Calculation (min) (ACUTE ONLY): 15 min  Problem List:  Patient Active Problem List   Diagnosis Date Noted  . CVA (cerebral vascular accident) ([place]) 02/17/2017  . Diplopia 02/03/2017  . Abnormality of gait 07/10/2015  . Cervical spondylosis without myelopathy 02/08/2013  . Neuralgia, neuritis, and radiculitis, unspecified 10/22/2011  . Intractable migraine without aura 10/22/2011   Past Medical History:  Past Medical History:  Diagnosis Date  . Abnormality of gait 07/10/2015  . Ankle fracture, left   . Arthritis   . Cervical spondylosis without myelopathy 02/08/2013  . Dyslipidemia   . Headache(784.0)    Chronic daily  . Hypertension   . Myelodysplasia   . Obesity   . Occipital neuralgia    Right  . Peptic ulcer disease   . Vitamin D deficiency    Past Surgical History:  Past Surgical History:  Procedure Laterality Date  . ABDOMINAL HYSTERECTOMY    . ANKLE FRACTURE SURGERY Left   . APPENDECTOMY    . BREAST LUMPECTOMY    . CATARACT EXTRACTION, BILATERAL    . DILATION AND CURETTAGE OF UTERUS    . LUMBAR LAMINECTOMY     Upper pending  . LUMBAR SPINE SURGERY     5 on occasions  . ROTATOR CUFF REPAIR Left   . THORACIC LAMINECTOMY  9/13  . TONSILLECTOMY    . TUBAL LIGATION Bilateral    HPI:  Kristin Mendez a 76 y.o.femalewith medical history significant ofchronic daily headaches, HTN, HLD. Around Thanksgiving, she developed several episode of double vision with the longest lasting 1 hour. About 1-2 weeks ago husband noticed some new memory issues and she was acting "different". No physical issues, just personality changes. She saw her neurologist, Dr. Jannifer Franklin on 12/05 who ordered an MRI. MRI was done today and report not final but per imaging show a CVA, acute in nature and moderate size. Per Dr. Jannifer Franklin,  does not appear to be what was causing her double vision. She was sent to the ER for evaluation.   Assessment / Plan / Recommendation Clinical Impression  Pt presents with functional/normal cognitive linguistic abilities c/b normal skills in attention, semi-complex problem solving, awareness, and speech intelligibility. ST further consulted with nursing who also reported normal cognitive functioning. No further skilled ST is indicated at this time.     SLP Assessment  SLP Recommendation/Assessment: Patient does not need any further Speech Lanaguage Pathology Services SLP Visit Diagnosis: Cognitive communication deficit (R41.841)    Follow Up Recommendations  None    Frequency and Duration           SLP Evaluation Cognition  Overall Cognitive Status: Within Functional Limits for tasks assessed Arousal/Alertness: Awake/alert Orientation Level: Oriented X4       Comprehension  Auditory Comprehension Overall Auditory Comprehension: Appears within functional limits for tasks assessed Reading Comprehension Reading Status: Within funtional limits    Expression Expression Primary Mode of Expression: Verbal Verbal Expression Overall Verbal Expression: Appears within functional limits for tasks assessed Written Expression Dominant Hand: Right Written Expression: Within Functional Limits   Oral / Motor  Oral Motor/Sensory Function Overall Oral Motor/Sensory Function: Within functional limits Motor Speech Overall Motor Speech: Appears within functional limits for tasks assessed Respiration: Within functional limits   GO                    Johnica Armwood Rutherford Nail  02/18/2017, 9:06 AM

## 2017-02-18 NOTE — Progress Notes (Addendum)
NEUROHOSPITALISTS STROKE TEAM - DAILY PROGRESS NOTE   ADMISSION HISTORY: Kristin Mendez is an 76 y.o. female with a past medical history of hypertension, hyperlipidemia, and myelodysplasia presenting to the ED for evaluation of an abnormal MRI. Patient was seen by neurology on an outpatient basis on February 03, 2017 for chronic daily headaches and new complaint of diplopia. An MRI was ordered at that time. MRI was done today and revealeda subacute large right anterior frontal lobe infarct.  Patient reports having headaches for several years and intermittent diplopia in her L eye only for the past 1 month. Reports having 3 episodes of diplopia in the past month, longest episode lasted an hour. Headaches are right sided frontal and associated with pain her right eye plus blurry vision. States she has been having intermittent difficulty getting words out for the past 6 months. Denies having any focal weakness, numbness, tingling, or gait problems. Denies having any new symptoms since her recent neurology appointment. Husband at bedside believes patient's personality has changed and she has been more confused lately. He is not able to elaborate any further on the personality change. Patient is currently using Stadol for her chronic headaches. She has not started using Aimovig yet. States is being followed by oncology and her Procrit dose was increased a month ago before her symptoms started.   SUBJECTIVE (INTERVAL HISTORY) Family is at the bedside. Patient is found laying in bed in NAD. Overall she feels her condition is stable. Voices no new complaints.  Denies any worsening headache. No new/acute events reported overnight.  OBJECTIVE Lab Results: CBC:  Recent Labs  Lab 02/17/17 1512 02/18/17 0453  WBC 2.6* 3.1*  HGB 9.9* 8.8*  HCT 31.5* 27.8*  MCV 101.9* 101.1*  PLT 75* 74*   BMP: Recent Labs  Lab 02/17/17 1512 02/18/17 0453  NA  137 140  K 3.4* 3.3*  CL 106 108  CO2 25 26  GLUCOSE 113* 153*  BUN 9 7  CREATININE 0.64 0.68  CALCIUM 9.2 9.0   Liver Function Tests:  Recent Labs  Lab 02/17/17 1512  AST 15  ALT 11*  ALKPHOS 57  BILITOT 1.6*  PROT 6.5  ALBUMIN 4.4   Coagulation Studies:  Recent Labs    02/17/17 1512  APTT 30  INR 1.01   Urine Drug Screen:     Component Value Date/Time   LABOPIA POSITIVE (A) 02/17/2017 1429   COCAINSCRNUR NONE DETECTED 02/17/2017 1429   LABBENZ POSITIVE (A) 02/17/2017 1429   AMPHETMU NONE DETECTED 02/17/2017 1429   THCU NONE DETECTED 02/17/2017 1429   LABBARB NONE DETECTED 02/17/2017 1429     PHYSICAL EXAM Temp:  [98.6 F (37 C)-99 F (37.2 C)] 98.6 F (37 C) (12/20 1421) Pulse Rate:  [58-91] 88 (12/20 1421) Resp:  [15-23] 17 (12/20 1421) BP: (105-184)/(52-92) 184/69 (12/20 1421) SpO2:  [95 %-100 %] 98 % (12/20 1421) General - Well nourished, well developed, in no apparent distress Respiratory - Lungs clear bilaterally. No wheezing. Cardiovascular - Regular rate and rhythm  Neurologic Examination: Mental Status: Alert, oriented x 3, thought content appropriate. Speech fluent without evidence of aphasia. Able to follow commands without difficulty. Has difficulty interpreting abstract phrases. Fails a simple go-no-go test. Mild impulsivity in responding when asked to follow a series of 3-step directional commands.  Cranial Nerves: II: Visual fields grossly normal,  III,IV, VI: ptosis not present, extra-ocular motions intact bilaterally pupils equal, round, reactive to light V,VII: smile symmetric, facial light touch sensation normal  bilaterally VIII: hearing grossly normal bilaterally IX,X: uvula rises symmetrically XI: bilateral shoulder shrug XII: midline tongue extension Motor: Right :Upper extremity 5/5Left: Upper extremity 5/5 Lower extremity  5/5Lower extremity 5/5 Tone and bulk:normal tone throughout; no atrophy noted Sensory: Pinprick and light touch intact throughout, bilaterally Deep Tendon Reflexes: 1+ in bilateral upper extremities and 0 in bilateral lower extremities  Plantars: Right: downgoingLeft: downgoing Cerebellar: Normal finger-to-nose, normal rapid alternating movements and normal heel-to-shin test  IMAGING: I have personally reviewed the radiological images below and agree with the radiology interpretations. Dg Chest 2 View Result Date: 02/17/2017 IMPRESSION: No acute pulmonary process identified. Electronically Signed   By: Kristine Garbe M.D.   On: 02/17/2017 20:04   MRI/ MRA Head Wo Contrast Result Date: 02/17/2017 IMPRESSION: 1. RIGHT M3 occlusion. 2. Severe stenosis proximal vertebral artery's versus skullbase artifact. Electronically Signed: By: Elon Alas M.D. On: 02/17/2017 19:18   Mr Brain Wo Contrast Result Date: 02/17/2017 FINDINGS: The brain parenchyma shows a large 4.8 x3 x 4.4 cm diffusion positive lesion in the right posterior frontal region with mixed subacute and a smaller acute infarct component.  The lesion is mostly not dark on the ADC map except for a small posterior medial portion.  There is also areas of microhemorrhages in the lesion.  There is mild surrounding cytotoxic edema but no significant effacement of the ventricle or midline shift is noted.  A smaller remote age infarct is noted in the right cerebellum.  There are mild changes of chronic microvascular ischemia.  There are mild changes of chronic microvascular ischemia.No abnormal lesions are seen on diffusion-weighted views to suggest acute ischemia. The cortical sulci, fissures and cisterns are normal in size and appearance. Lateral, third and fourth ventricle are normal in size and appearance. No extra-axial fluid collections are seen. No evidence  of mass effect or midline shift.  On sagittal views the posterior fossa, pituitary gland and corpus callosum are unremarkable. No evidence of intracranial hemorrhage on gradient-echo views. The orbits and their contents, paranasal sinuses and calvarium are unremarkable.  Intracranial flow voids are present.   Abnormal MRI scan of the brain showing a large  Right posterior frontal mostly subacute infarct with a small acute and hemorrhagic component.  There is surrounding mild cytotoxic edema but without significant midline shift.  There are mild changes of chronic microvascular ischemia and paranasal sinusitis INTERPRETING PHYSICIAN: PRAMOD SETHI, MD Certified in  Neuroimaging by [employer] and [employer]   Echocardiogram:  Study Conclusions - Left ventricle: The cavity size was normal. Systolic function was   vigorous. The estimated ejection fraction was in the range of 65%   to 70%. Wall motion was normal; there were no regional wall   motion abnormalities. Doppler parameters are consistent with   abnormal left ventricular relaxation (grade 1 diastolic   dysfunction). Doppler parameters are consistent with   indeterminate ventricular filling pressure. - Aortic valve: Transvalvular velocity was within the normal range.   There was no stenosis. There was no regurgitation. Valve area   (Vmax): 2.2 cm^2. - Mitral valve: Transvalvular velocity was within the normal range.   There was no evidence for stenosis. There was mild regurgitation. - Left atrium: The atrium was mildly dilated. - Right ventricle: The cavity size was normal. Wall thickness was   normal. Systolic function was normal. - Tricuspid valve: There was mild regurgitation. - Pulmonary arteries: Systolic pressure was within the normal   range. PA peak pressure:  22 mm Hg (S  B/L LE Duplex:      negative for DVT.                                    TEE /Loop Recorder:                                         PENDING IN AM CTA NECK:                                                          PENDING    ASSESSMENT: Ms. Kristin Mendez is a 76 y.o. female with PMH of hypertension, hyperlipidemia, and myelodysplasia presenting with chronic headaches and a month history of intermittent diplopia in her left eye. She reports having word-finding difficulty for the past 6 months and has been confused lately per family. Imaging showinga subacutelargeright anterior frontal lobe infarct. The lack of motor deficits and aphasia could be explained by the location of the infarct. Subtle impairment of frontal executive function noted on exam. MRI reveals:  Large 4.8 x3 x 4.4 cm right posterior frontal region with mixed subacute and a smaller acute infarct component  STROKE:  Suspected Etiology: Likely cardioembolic Resultant Symptoms: Headache Stroke Risk Factors: hyperlipidemia and hypertension Other Stroke Risk Factors: Advanced age, Family hx stroke, Migraines  Outstanding Stroke Work-up Studies:     TEE /Loop Recorder:                                             PENDING CTA NECK:                                                            PENDING  02/18/2017: Neuro exam remains stable.  Cardiogram and lower extremity Dopplers with no acute findings.  CTA neck pending.  Continue aspirin and statin for now.  TEE and loop recorder planned for AM.  PLAN  02/18/2017: Continue Aspirin/ Statin Frequent neuro checks Telemetry monitoring PT/OT/SLP TEE and Loop Recorder Placement in AM Ongoing aggressive stroke risk factor management Patient counseled to be compliant with her antithrombotic medications Patient counseled on Lifestyle modifications including, Diet, Exercise, and Stress Follow up with [facility] in 6 weeks  HX OF STROKES: smaller remote age infarct is noted in the right cerebellum.   INTRACRANIAL Atherosclerosis &Stenosis: RIGHT M3 occlusion, Severe  stenosis proximal vertebral artery's vs skullbase artifact. May consider DAPT at discharge based on CTA imaging  R/O AFIB: TEE and Loop Recorder Placement in a.m. N.p.o. after midnight  Hx of migraines Continue home dose of Stadol We will continue to monitor closely  MEDICAL ISSUES: Anemia Thrombocytopenia Neutropenia Likely B12 deficiency Hypokalemia MDS  HYPERTENSION: Stable, some elevated blood pressures overnight noted Permissive hypertension (OK if <220/120) for 24-48 hours post stroke and then gradually normalized  within 5-7 days. Long term BP goal normotensive. May slowly restart home B/P medications after 48 hours Home Meds: Inderal  HYPERLIPIDEMIA:    Component Value Date/Time   CHOL 105 02/18/2017 0453   TRIG 72 02/18/2017 0453   HDL 39 (L) 02/18/2017 0453   CHOLHDL 2.7 02/18/2017 0453   VLDL 14 02/18/2017 0453   LDLCALC 52 02/18/2017 0453  Home Meds: Crestor 10 LDL  goal < 70 Continued on Crestor 10 mg daily Continue statin at discharge  R/O DIABETES: Lab Results  Component Value Date   HGBA1C 5.1 02/18/2017  HgbA1c goal < 7.0  Other Active Problems: Active Problems:   Intractable migraine without aura   Diplopia   CVA (cerebral vascular accident) [facility])  Hospital day # 1 VTE prophylaxis: Lovenox  Diet : Diet regular Room service appropriate? Yes; Fluid consistency: Thin   FAMILY UPDATES: family at bedside  TEAM UPDATES: Cristal Ford, DO     Prior Home Stroke Medications:  No antithrombotic  Discharge Stroke Meds:  Please discharge patient on aspirin 81 mg daily   Disposition: Final discharge disposition not confirmed Therapy Recs:               PENDING Home Equipment:         PENDING Follow Up:  Follow-up Information    Kathrynn Ducking, MD. Schedule an appointment as soon as possible for a visit in 6 week(s).   Specialty:  Neurology Contact information: [address] [place] [address] [phone]            Cyndy Freeze, MD -PCP Follow up in 1-2 weeks     Renie Ora Stroke Neurology Team 02/18/2017 2:34 PM  ATTENDING NOTE: I reviewed above note and agree with the assessment and plan. I have made any additions or clarifications directly to the above note. Pt was seen and examined.   76 year old female with history of hypertension, hyperlipidemia, migraine headache, episodic vertical diplopia for the last 1 month, chronic myelodysplasia with pancytopenia admitted for abnormal MRI.  MRI as outpatient showed right MCA infarct with MRA showed right M3 occlusion, concerning for cardioembolic source.  LE venous Doppler negative for DVT, EF 65-70%.  CTA neck pending, LDL 52 and A1c 5.1.  Still has pancytopenia with WBC 2.6, hemoglobin 9.9 and platelets 75.  Discussed with Dr. Hinton Rao, hematologist at [facility], patient platelet stable, not contraindication for further anticoagulation if cardioembolic source found.  Of note she is getting erythropoietin injection from Dr. Hinton Rao who suspect it may be part of the stroke etiology.  I am not still convinced about that as patient still anemic, not polycythemia.  We will continue plan of TEE and loop recorder to evaluate cardioembolic source.  Patient is in agreement.  Continue baby aspirin and Crestor for stroke prevention.  Will follow.  Rosalin Hawking, MD PhD Stroke Neurology 02/18/2017 10:02 PM     To contact Stroke Continuity provider, please refer to [email or web address] After hours, contact General Neurology

## 2017-02-18 NOTE — Progress Notes (Signed)
Initial Nutrition Assessment  DOCUMENTATION CODES:   Not applicable  INTERVENTION:   Ensure Enlive po BID, each supplement provides 350 kcal and 20 grams of protein  NUTRITION DIAGNOSIS:   Increased nutrient needs related to acute illness as evidenced by estimated needs.  GOAL:   Patient will meet greater than or equal to 90% of their needs  MONITOR:   PO intake, Supplement acceptance, Weight trends, I & O's  REASON FOR ASSESSMENT:   Malnutrition Screening Tool   ASSESSMENT:   Pt with PMH of HTN, HLD, and chronic headaches presents with double vision. MRI showed subacute right frontal stroke.   Spoke with pt and multiple family members at bedside. Pt reports a decrease in appetite and unintentional weight loss of ~8 lbs in the past 3 months. Pt reports since admission her appetite is increasing. Per chart review, pt consumed 100% of breakfast this morning. Pt enjoying Ensure and willing to continue supplementation. Pt's husband at bedside reports he bought Boost for home use when appetite is low.   Labs reviewed; K 3.3, Vitamin B12 1401, Hemoglobin 8.8 Medications reviewed; Protonix, vitamin B12   NUTRITION - FOCUSED PHYSICAL EXAM:    Most Recent Value  Orbital Region  No depletion  Upper Arm Region  No depletion  Thoracic and Lumbar Region  No depletion  Buccal Region  No depletion  Temple Region  Mild depletion  Clavicle Bone Region  No depletion  Clavicle and Acromion Bone Region  No depletion  Scapular Bone Region  Unable to assess  Dorsal Hand  No depletion  Patellar Region  Mild depletion  Anterior Thigh Region  Moderate depletion  Posterior Calf Region  Mild depletion  Edema (RD Assessment)  None       Diet Order:  Diet regular Room service appropriate? Yes; Fluid consistency: Thin  EDUCATION NEEDS:   No education needs have been identified at this time  Skin:  Skin Assessment: Reviewed RN Assessment  Last BM:  02/16/17  Height:   Ht Readings  from Last 1 Encounters:  02/17/17 4\' 9"  (1.448 m)    Weight:   Wt Readings from Last 1 Encounters:  02/17/17 130 lb (59 kg)    Ideal Body Weight:  43.2 kg  BMI:  Body mass index is 28.13 kg/m.  Estimated Nutritional Needs:   Kcal:  1450-1650  Protein:  71-85 grams  Fluid:  >/= 1.45 L/d  Parks Ranger, MS, RDN, LDN 02/18/2017 2:37 PM

## 2017-02-18 NOTE — Progress Notes (Signed)
  Echocardiogram 2D Echocardiogram has been performed.  Kristin Mendez T Kristin Mendez 02/18/2017, 10:22 AM

## 2017-02-18 NOTE — Telephone Encounter (Signed)
  MRI of the brain showed evidence of a subacute right frontal stroke, the patient was sent to the emergency room for evaluation.  The patient is currently in the hospital, workup is underway.  MRI brain 02/17/17:  IMPRESSION: Abnormal MRI scan of the brain showing a large  Right posterior frontal mostly subacute infarct with a small acute and hemorrhagic component.  There is surrounding mild cytotoxic edema but without significant midline shift.  There are mild changes of chronic microvascular ischemia and paranasal sinusitis

## 2017-02-18 NOTE — Progress Notes (Signed)
LE venous duplex prelim: negative for DVT. Orville Widmann Eunice, RDMS, RVT  

## 2017-02-18 NOTE — Evaluation (Signed)
Physical Therapy Evaluation Patient Details Name: Kristin Mendez MRN: [id number] DOB: 1941-01-22 Today's Date: 02/18/2017   History of Present Illness  76 y.o. female with PMH of hypertension, hyperlipidemia, and myelodysplasia presenting with chronic headaches and a month history of intermittent diplopia. MRI + subacute large right anterior frontal lobe infarct.  Clinical Impression  Pt admitted with above diagnosis. Pt currently with functional limitations due to the deficits listed below (see PT Problem List). Pt is limited in her safe mobility by decrease in cognitive function and minor instability with higher level balance activities. Pt is currently mod I for bed mobility, and transfers, supervision for ambulation of 300 feet without AD and min guard for ascend/descend of 10 steps. Pt will benefit from skilled PT to increase their independence and safety with mobility to allow discharge to the venue listed below.       Follow Up Recommendations Outpatient PT;Supervision/Assistance - 24 hour    Equipment Recommendations  None recommended by PT       Precautions / Restrictions Precautions Precautions: None Restrictions Weight Bearing Restrictions: No      Mobility  Bed Mobility Overal bed mobility: Modified Independent                Transfers Overall transfer level: Modified independent                  Ambulation/Gait Ambulation/Gait assistance: Supervision Ambulation Distance (Feet): 300 Feet Assistive device: None Gait Pattern/deviations: Decreased dorsiflexion - left;Shuffle;Trunk flexed;Step-through pattern Gait velocity: slowed Gait velocity interpretation: Below normal speed for age/gender General Gait Details: supervision for safety, decreased foot clerance on L LE with gait progression, pt with difficulty finding her room after walking passed room three times with cuing to look at room numbers  Stairs Stairs: Yes Stairs assistance: Min  guard Stair Management: One rail Left;Forwards;Alternating pattern Number of Stairs: 10 General stair comments: min guard for safety strong, steady ascent/descent  Modified Rankin (Stroke Patients Only) Modified Rankin (Stroke Patients Only) Pre-Morbid Rankin Score: No symptoms Modified Rankin: Slight disability     Balance Overall balance assessment: Needs assistance                           High level balance activites: Head turns High Level Balance Comments: slight instability and decreased in speed with head turns              Pertinent Vitals/Pain Pain Assessment: No/denies pain    Home Living Family/patient expects to be discharged to:: Private residence Living Arrangements: Spouse/significant other Available Help at Discharge: Family Type of Home: House Home Access: Stairs to enter Entrance Stairs-Rails: Right Entrance Stairs-Number of Steps: 6 Home Layout: Multi-level Home Equipment: Environmental consultant - 2 wheels;Cane - single point;Crutches;Shower seat - built in;Bedside commode      Prior Function Level of Independence: Independent         Comments: enjoys reading; drives occasionally     Hand Dominance   Dominant Hand: Right    Extremity/Trunk Assessment   Upper Extremity Assessment Upper Extremity Assessment: Defer to OT evaluation LUE Deficits / Details: LUE functional; mild weakness noted    Lower Extremity Assessment Lower Extremity Assessment: LLE deficits/detail LLE Deficits / Details: weaker in comparison to R LE    Cervical / Trunk Assessment Cervical / Trunk Assessment: Other exceptions(multiple back surgeries)  Communication   Communication: Expressive difficulties(word finding difficulties at times)  Cognition   Behavior During Therapy: Flat affect Overall  Cognitive Status: Impaired/Different from baseline Area of Impairment: Attention;Memory;Awareness;Problem solving                   Current Attention Level:  Sustained Memory: Decreased short-term memory     Awareness: Emergent Problem Solving: Slow processing;Difficulty sequencing;Requires verbal cues General Comments: Difficulty finding her room after walking; L inattention noted; pt states she has a harder time reading; husband staes she leaves things "undone, like wrapping packages"      General Comments General comments (skin integrity, edema, etc.): Husband present and recounted pts decreased memory and attention        Assessment/Plan    PT Assessment Patient needs continued PT services  PT Problem List Decreased balance;Decreased cognition       PT Treatment Interventions Neuromuscular re-education;Cognitive remediation;Patient/family education;Balance training;Gait training;Functional mobility training    PT Goals (Current goals can be found in the Care Plan section)  Acute Rehab PT Goals Patient Stated Goal: to go home PT Goal Formulation: With patient Time For Goal Achievement: 03/04/17 Potential to Achieve Goals: Good Additional Goals Additional Goal #1: Pt will be able to perform gait while recounting serial sevens to 56 without change in gait speed    Frequency Min 4X/week    AM-PAC PT "6 Clicks" Daily Activity  Outcome Measure Difficulty turning over in bed (including adjusting bedclothes, sheets and blankets)?: A Little Difficulty moving from lying on back to sitting on the side of the bed? : A Little Difficulty sitting down on and standing up from a chair with arms (e.g., wheelchair, bedside commode, etc,.)?: A Little Help needed moving to and from a bed to chair (including a wheelchair)?: None Help needed walking in hospital room?: None Help needed climbing 3-5 steps with a railing? : None 6 Click Score: 21    End of Session Equipment Utilized During Treatment: Gait belt Activity Tolerance: Patient tolerated treatment well Patient left: in bed;with call bell/phone within reach;with nursing/sitter in  room;with family/visitor present Nurse Communication: Mobility status PT Visit Diagnosis: Other abnormalities of gait and mobility (R26.89);Other symptoms and signs involving the nervous system (U72.536)    Time: 6440-3474 PT Time Calculation (min) (ACUTE ONLY): 18 min   Charges:   PT Evaluation $PT Eval Moderate Complexity: 1 Mod     PT G Codes:        Jojo Pehl B. Migdalia Dk PT, DPT Acute Rehabilitation  [phone] Pager [phone]    [place] 02/18/2017, 5:57 PM

## 2017-02-18 NOTE — H&P (View-Only) (Signed)
PROGRESS NOTE    Kristin Mendez  KDX:[id number] DOB: 1940/12/12 DOA: 02/17/2017 PCP: Cyndy Freeze, MD   Chief Complaint  Patient presents with  . Migraine    Brief Narrative:  HPI on 02/17/2017 by Dr. Eulogio Bear Kristin Mendez is a 76 y.o. female with medical history significant of chronic daily headaches, HTN, HLD.  Around Thanksgiving, she developed several episode of double vision with the longest lasting 1 hour.  About 1-2 weeks ago husband noticed some new memory issues and she was acting "different".  No physical issues, just personality changes.  She saw her neurologist, Dr. Jannifer Franklin on 12/05 who ordered an MRI.  MRI was done today and report not final but per imaging show a CVA, acute in nature and moderate size.  Per Dr. Jannifer Franklin, does not appear to be what was causing her double vision.  She was sent to the ER for evaluation.   In the ER, labs were pending. Neurology was consulted and hospitalist called for admission.  Assessment & Plan   Subacute CVA -MRI brain showed right posterior frontal mostly subacute infarct with a small acute hemorrhagic component. Surrounding mild cytotoxic edema but without significant midline shift. -MRA head showed right M3 occlusion, severe stenosis proximal vertebral arteries versus skull base artifact -Echocardiogram shows an EF of 97-67%, grade 1 diastolic dysfunction. No RWMA. -Lower extremity Doppler negative for DVT -LDL 52, hemoglobin A1c 5.1 -Neurology consulted and appreciated, recommended TEE -Cardiology consulted, TEE to be done on 02/19/2017 -Continue aspirin, statin  History of migraines -Uses Stadol and Aimovig for headaches.   Diplopia -Resolved, patient had episodes in the past but the longest episode lasting 1 hour  MDS -Receives weekly Proc[facility] at [facility]  GERD -Continue PPI  DVT Prophylaxis  Lovenox  Code Status: Full  Family Communication: husband at beside  Disposition Plan: Admitted. Pending TEE on  02/19/2017  Consultants Neurology Cardiology  Procedures  Echocardiogram LE doppler  Antibiotics   Anti-infectives (From admission, onward)   None      Subjective:   [name] seen and examined today.  Has no complaints this morning. Denies any current chest pain, shortness breath, dental pain, nausea,, diarrhea constipation, dizziness or headache or changes in vision.  Objective:   Vitals:   02/18/17 0000 02/18/17 0500 02/18/17 1032 02/18/17 1421  BP:  (!) 148/58 (!) 156/62 (!) 184/69  Pulse:  73 81 88  Resp: 20 19 16 17   Temp: 98.7 F (37.1 C) 98.7 F (37.1 C) 99 F (37.2 C) 98.6 F (37 C)  TempSrc: Oral Oral Oral Oral  SpO2: 99% 97% 97% 98%  Weight:      Height:        Intake/Output Summary (Last 24 hours) at 02/18/2017 1518 Last data filed at 02/18/2017 3419 Gross per 24 hour  Intake 120 ml  Output -  Net 120 ml   Filed Weights   02/17/17 1421  Weight: 59 kg (130 lb)    Exam  General: Well developed, well nourished, NAD, appears stated age  HEENT: NCAT, mucous membranes moist.   Cardiovascular: S1 S2 auscultated, no rubs, murmurs or gallops. Regular rate and rhythm.  Respiratory: Clear to auscultation bilaterally with equal chest rise  Abdomen: Soft, nontender, nondistended, + bowel sounds  Extremities: warm dry without cyanosis clubbing or edema  Neuro: AAOx3, nonfocal  Psych: Normal affect and demeanor with intact judgement and insight   Data Reviewed: I have personally reviewed following labs and imaging studies  CBC: Recent  Labs  Lab 02/17/17 1512 02/18/17 0453  WBC 2.6* 3.1*  NEUTROABS 0.9*  --   HGB 9.9* 8.8*  HCT 31.5* 27.8*  MCV 101.9* 101.1*  PLT 75* 74*   Basic Metabolic Panel: Recent Labs  Lab 02/17/17 1512 02/18/17 0453  NA 137 140  K 3.4* 3.3*  CL 106 108  CO2 25 26  GLUCOSE 113* 153*  BUN 9 7  CREATININE 0.64 0.68  CALCIUM 9.2 9.0   GFR: Estimated Creatinine Clearance: 42.8 mL/min (by C-G formula  based on SCr of 0.68 mg/dL). Liver Function Tests: Recent Labs  Lab 02/17/17 1512  AST 15  ALT 11*  ALKPHOS 57  BILITOT 1.6*  PROT 6.5  ALBUMIN 4.4   No results for input(s): LIPASE, AMYLASE in the last 168 hours. No results for input(s): AMMONIA in the last 168 hours. Coagulation Profile: Recent Labs  Lab 02/17/17 1512  INR 1.01   Cardiac Enzymes: No results for input(s): CKTOTAL, CKMB, CKMBINDEX, TROPONINI in the last 168 hours. BNP (last 3 results) No results for input(s): PROBNP in the last 8760 hours. HbA1C: Recent Labs    02/18/17 0453  HGBA1C 5.1   CBG: No results for input(s): GLUCAP in the last 168 hours. Lipid Profile: Recent Labs    02/18/17 0453  CHOL 105  HDL 39*  LDLCALC 52  TRIG 72  CHOLHDL 2.7   Thyroid Function Tests: No results for input(s): TSH, T4TOTAL, FREET4, T3FREE, THYROIDAB in the last 72 hours. Anemia Panel: No results for input(s): VITAMINB12, FOLATE, FERRITIN, TIBC, IRON, RETICCTPCT in the last 72 hours. Urine analysis:    Component Value Date/Time   COLORURINE YELLOW 02/17/2017 1429   APPEARANCEUR CLEAR 02/17/2017 1429   LABSPEC 1.006 02/17/2017 1429   PHURINE 7.0 02/17/2017 1429   GLUCOSEU NEGATIVE 02/17/2017 1429   HGBUR NEGATIVE 02/17/2017 1429   BILIRUBINUR NEGATIVE 02/17/2017 1429   KETONESUR NEGATIVE 02/17/2017 1429   PROTEINUR NEGATIVE 02/17/2017 1429   NITRITE NEGATIVE 02/17/2017 1429   LEUKOCYTESUR NEGATIVE 02/17/2017 1429   Sepsis Labs: @LABRCNTIP (procalcitonin:4,lacticidven:4)  )No results found for this or any previous visit (from the past 240 hour(s)).    Radiology Studies: Dg Chest 2 View  Result Date: 02/17/2017 CLINICAL DATA:  76 y/o  F; intracranial hemorrhage. EXAM: CHEST  2 VIEW COMPARISON:  03/26/2009 chest radiograph FINDINGS: Stable normal cardiac silhouette. Aortic atherosclerosis with calcification. Clear lungs. No pleural effusion or pneumothorax. No acute osseous abnormality is evident.  Stable severe discogenic degenerative changes and mild degenerative loss of height of lower thoracic vertebral bodies. IMPRESSION: No acute pulmonary process identified. Electronically Signed   By: Kristine Garbe M.D.   On: 02/17/2017 20:04   Mr Jodene Nam Head Wo Contrast  Addendum Date: 02/17/2017   ADDENDUM REPORT: 02/17/2017 19:41 ADDENDUM: Acute findings discussed with and reconfirmed by Dr.Nanavati on 02/17/2017 at 7:30 pm. Electronically Signed   By: Elon Alas M.D.   On: 02/17/2017 19:41   Result Date: 02/17/2017 CLINICAL DATA:  Diplopia for 2 months, headache, confusion and gait instability. EXAM: MRA HEAD WITHOUT CONTRAST TECHNIQUE: Angiographic images of the Circle of Willis were obtained using MRA technique without intravenous contrast. COMPARISON:  MRI of the head February 17, 2017 at 1213 hours FINDINGS: ANTERIOR CIRCULATION: Normal flow related enhancement of the included cervical, petrous, cavernous and supraclinoid internal carotid arteries. Patent anterior communicating artery. RIGHT anterior M3 occlusion. Otherwise patent anterior and middle cerebral arteries. No large vessel occlusion, flow limiting stenosis, aneurysm. POSTERIOR CIRCULATION: Codominant vertebral artery's with focal  stenosis bilateral vertebral artery's, at skull base. Basilar artery is patent, with normal flow related enhancement of the main branch vessels. Patent posterior cerebral arteries. Robust bilateral posterior communicating artery's. No large vessel occlusion, aneurysm. ANATOMIC VARIANTS: Hypoplastic bilateral P1 segment. Source images and MIP images were reviewed. IMPRESSION: 1. RIGHT M3 occlusion. 2. Severe stenosis proximal vertebral artery's versus skullbase artifact. Electronically Signed: By: Elon Alas M.D. On: 02/17/2017 19:18   Mr Brain Wo Contrast  Result Date: 02/17/2017  [facility] [address] [place], [place] [address] [phone] NEUROIMAGING  REPORT STUDY DATE: 02/17/2017 PATIENT NAME: LEANNY MOECKEL DOB: February 18, 1941 MRN: [id number] ORDERING CLINICIAN: Dr Jannifer Franklin CLINICAL HISTORY: 46 year patient with headaches and diplopia COMPARISON FILMS:  none EXAM: MRI Brain wo TECHNIQUE: MRI of the brain without contrast was obtained utilizing 5 mm axial slices with T1, T2, T2 flair, T2 star gradient echo and diffusion weighted views.  T1 sagittal and T2 coronal views were obtained.  Study is slightly limited by motion artifacts CONTRAST: none IMAGING SITE: [place] FINDINGS: The brain parenchyma shows a large 4.8 x3 x 4.4 cm diffusion positive lesion in the right posterior frontal region with mixed subacute and a smaller acute infarct component.  The lesion is mostly not dark on the ADC map except for a small posterior medial portion.  There is also areas of microhemorrhages in the lesion.  There is mild surrounding cytotoxic edema but no significant effacement of the ventricle or midline shift is noted.  A smaller remote age infarct is noted in the right cerebellum.  There are mild changes of chronic microvascular ischemia.  There are mild changes of chronic microvascular ischemia.No abnormal lesions are seen on diffusion-weighted views to suggest acute ischemia. The cortical sulci, fissures and cisterns are normal in size and appearance. Lateral, third and fourth ventricle are normal in size and appearance. No extra-axial fluid collections are seen. No evidence of mass effect or midline shift.  On sagittal views the posterior fossa, pituitary gland and corpus callosum are unremarkable. No evidence of intracranial hemorrhage on gradient-echo views. The orbits and their contents, paranasal sinuses and calvarium are unremarkable.  Intracranial flow voids are present.   Abnormal MRI scan of the brain showing a large  Right posterior frontal mostly subacute infarct with a small acute and hemorrhagic component.  There is surrounding mild cytotoxic edema but  without significant midline shift.  There are mild changes of chronic microvascular ischemia and paranasal sinusitis INTERPRETING PHYSICIAN: PRAMOD SETHI, MD Certified in  Neuroimaging by [employer] and [employer]     Scheduled Meds: . aspirin EC  81 mg Oral Daily  . doxepin  50 mg Oral QHS  . enoxaparin (LOVENOX) injection  40 mg Subcutaneous Q24H  . feeding supplement (ENSURE ENLIVE)  237 mL Oral BID BM  . pantoprazole  80 mg Oral Daily  . propranolol  40 mg Oral BID  . rosuvastatin  10 mg Oral q1800  . vitamin B-12  1,000 mcg Oral Daily   Continuous Infusions:   LOS: 1 day   Time Spent in minutes   30 minutes  Cressida Milford D.O. on 02/18/2017 at 3:18 PM  Between 7am to 7pm - Pager - [phone]  After 7pm go to [email or web address] - password [email or web address]  And look for the night coverage person covering for me after hours  [facility] Office  [phone]

## 2017-02-19 ENCOUNTER — Inpatient Hospital Stay (HOSPITAL_COMMUNITY): Admit: 2017-02-19 | Payer: Medicare Other

## 2017-02-19 ENCOUNTER — Encounter (HOSPITAL_COMMUNITY): Payer: Self-pay

## 2017-02-19 ENCOUNTER — Encounter (HOSPITAL_COMMUNITY)
Admission: EM | Disposition: A | Discharge status: Home / Self Care | Payer: Self-pay | Source: Home / Self Care | Attending: Internal Medicine

## 2017-02-19 ENCOUNTER — Ambulatory Visit (HOSPITAL_COMMUNITY): Admission: RE | Admit: 2017-02-19 | Payer: Medicare Other | Source: Ambulatory Visit | Admitting: Cardiology

## 2017-02-19 DIAGNOSIS — E876 Hypokalemia: Secondary | ICD-10-CM

## 2017-02-19 DIAGNOSIS — I639 Cerebral infarction, unspecified: Secondary | ICD-10-CM

## 2017-02-19 DIAGNOSIS — D469 Myelodysplastic syndrome, unspecified: Secondary | ICD-10-CM

## 2017-02-19 DIAGNOSIS — I6389 Other cerebral infarction: Secondary | ICD-10-CM

## 2017-02-19 HISTORY — PX: LOOP RECORDER INSERTION: EP1214

## 2017-02-19 LAB — BASIC METABOLIC PANEL
Anion gap: 7 (ref 5–15)
BUN: 7 mg/dL (ref 6–20)
CALCIUM: 9.3 mg/dL (ref 8.9–10.3)
CHLORIDE: 103 mmol/L (ref 101–111)
CO2: 28 mmol/L (ref 22–32)
CREATININE: 0.63 mg/dL (ref 0.44–1.00)
GFR calc Af Amer: >60 mL/min (ref >60)
GFR calc non Af Amer: >60 mL/min (ref >60)
Glucose, Bld: 173 mg/dL — ABNORMAL HIGH (ref 65–99)
Potassium: 3.3 mmol/L — ABNORMAL LOW (ref 3.5–5.1)
SODIUM: 138 mmol/L (ref 135–145)

## 2017-02-19 LAB — PROTIME-INR
INR: 1.04
PROTHROMBIN TIME: 13.5 s (ref 11.4–15.2)

## 2017-02-19 SURGERY — INVASIVE LAB ABORTED CASE

## 2017-02-19 SURGERY — LOOP RECORDER INSERTION
Anesthesia: LOCAL

## 2017-02-19 MED ORDER — ENSURE ENLIVE PO LIQD: 1.0000 | Freq: Two times a day (BID) | ORAL | 0 refills | Status: DC

## 2017-02-19 MED ORDER — MIDAZOLAM HCL 5 MG/ML IJ SOLN
INTRAMUSCULAR | Status: AC
  Filled 2017-02-19: qty 2

## 2017-02-19 MED ORDER — MIDAZOLAM HCL 10 MG/2ML IJ SOLN
INTRAMUSCULAR | Status: DC | PRN
  Administered 2017-02-19: 2 mg via INTRAVENOUS
  Administered 2017-02-19 (×3): 1 mg via INTRAVENOUS
  Administered 2017-02-19: 2 mg via INTRAVENOUS

## 2017-02-19 MED ORDER — SODIUM CHLORIDE 0.9 % IV SOLN
INTRAVENOUS | Status: DC
  Administered 2017-02-19: 500 mL via INTRAVENOUS

## 2017-02-19 MED ORDER — FENTANYL CITRATE (PF) 100 MCG/2ML IJ SOLN
INTRAMUSCULAR | Status: DC | PRN
  Administered 2017-02-19: 12.5 ug via INTRAVENOUS
  Administered 2017-02-19 (×2): 25 ug via INTRAVENOUS

## 2017-02-19 MED ORDER — ASPIRIN 81 MG PO TBEC
81.0000 mg | DELAYED_RELEASE_TABLET | Freq: Every day | ORAL | 0 refills | Status: AC
Start: 2017-02-20 — End: ?

## 2017-02-19 MED ORDER — LIDOCAINE-EPINEPHRINE 1 %-1:100000 IJ SOLN
INTRAMUSCULAR | Status: DC | PRN
  Administered 2017-02-19: 30 mL

## 2017-02-19 MED ORDER — POTASSIUM CHLORIDE CRYS ER 20 MEQ PO TBCR
40.0000 meq | EXTENDED_RELEASE_TABLET | Freq: Once | ORAL | Status: AC
Start: 2017-02-19 — End: 2017-02-19
  Administered 2017-02-19: 40 meq via ORAL
  Filled 2017-02-19: qty 2

## 2017-02-19 MED ORDER — FENTANYL CITRATE (PF) 100 MCG/2ML IJ SOLN
INTRAMUSCULAR | Status: AC
  Filled 2017-02-19: qty 2

## 2017-02-19 MED ORDER — DIPHENHYDRAMINE HCL 50 MG/ML IJ SOLN
INTRAMUSCULAR | Status: AC
  Filled 2017-02-19: qty 1

## 2017-02-19 MED ORDER — LIDOCAINE VISCOUS 2 % MT SOLN
OROMUCOSAL | Status: AC
  Filled 2017-02-19: qty 15

## 2017-02-19 SURGICAL SUPPLY — 2 items
LOOP REVEAL LINQSYS (Prosthesis & Implant Heart) ×1 IMPLANT
PACK LOOP INSERTION (CUSTOM PROCEDURE TRAY) ×2 IMPLANT

## 2017-02-19 NOTE — Discharge Summary (Signed)
Physician Discharge Summary  Kristin Mendez RFX:[id number] DOB: [birth year] DOA: 02/17/2017  PCP: Cyndy Freeze, MD  Admit date: 02/17/2017 Discharge date: 02/19/2017  Time spent: 45 minutes  Recommendations for Outpatient Follow-up:  Patient will be discharged to home with outpatient physical and occupational therapy.  Patient will need to follow up with primary care provider within one week of discharge, repeat BMP.  Follow up with neurology, Dr. Erlinda Hong or Jannifer Franklin, in 6 weeks. Patient should continue medications as prescribed.  Patient should follow a regular diet.   Discharge Diagnoses:  Subacute CVA History of migraines Diplopia MDS GERD  Discharge Condition: stable   Diet recommendation: regular  Filed Weights   02/17/17 1421  Weight: 59 kg (130 lb)    History of present illness:  on 02/17/2017 by Dr. Birdie Sons Faggis a 76 y.o.femalewith medical history significant ofchronic daily headaches, HTN, HLD. Around Thanksgiving, she developed several episode of double vision with the longest lasting 1 hour. About 1-2 weeks ago husband noticed some new memory issues and she was acting "different". No physical issues, just personality changes. She saw her neurologist, Dr. Jannifer Franklin on 12/05 who ordered an MRI. MRI was done today and report not final but per imaging show a CVA, acute in nature and moderate size. Per Dr. Jannifer Franklin, does not appear to be what was causing her double vision. She was sent to the ER for evaluation.  In the ER, labs were pending. Neurology was consulted and hospitalist called for admission.  Hospital Course:  Subacute CVA -MRI brain showed right posterior frontal mostly subacute infarct with a small acute hemorrhagic component. Surrounding mild cytotoxic edema but without significant midline shift. -MRA head showed right M3 occlusion, severe stenosis proximal vertebral arteries versus skull base artifact -Echocardiogram shows an EF of 65-70%,  grade 1 diastolic dysfunction. No RWMA. -Lower extremity Doppler negative for DVT -LDL 52, hemoglobin A1c 5.1 -Neurology consulted and appreciated, recommended TEE -Cardiology consulted, TEE attempted, however patient could not swallow probe.  -loop recorder placed -Continue aspirin, statin -PT and OT recommended outpatient physical therapy  History of migraines -Uses Stadol, inderal, and Aimovig for headaches.   Diplopia -Resolved, patient had episodes in the past but the longest episode lasting 1 hour  MDS-Anemia/Thrombocytopenia -Rece[facility]- follows with hematologist Dr. Hinton Rao -no baseline labs  GERD -Continue PPI  Hypokalemia -replaced, repeat BMP in one week  Consultants Neurology Cardiology/EP  Procedures  Echocardiogram LE doppler TEE attempted Loop Recorder placed  Discharge Exam: Vitals:   02/19/17 0910 02/19/17 1051  BP: (!) 153/61 (!) 148/54  Pulse: 81 86  Resp: (!) 23 20  Temp:  98.2 F (36.8 C)  SpO2: 98% 98%   Patient with no complaints today. Denies current chest pain, shortness breath, bowel pain, nausea vomiting, diarrhea constipation, changes in vision or dizziness.   General: Well developed, well nourished, NAD, appears stated age  HEENT: NCAT, mucous membranes moist.  Cardiovascular: S1 S2 auscultated, no rubs, murmurs or gallops. Regular rate and rhythm.  Respiratory: Clear to auscultation bilaterally with equal chest rise  Abdomen: Soft, nontender, nondistended, + bowel sounds  Extremities: warm dry without cyanosis clubbing or edema  Neuro: AAOx3, nonfocal  Psych: Normal affect and demeanor with intact judgement and insight, pleasant  Discharge Instructions Discharge Instructions    Ambulatory referral to Neurology   Complete by:  As directed    An appointment is requested in approximately: 6 weeks   Ambulatory referral to Physical Therapy  Complete by:  As directed    Occupational  therapy (neuro) and physical therapy   Discharge instructions   Complete by:  As directed    Patient will be discharged to home with outpatient physical and occupational therapy.  Patient will need to follow up with primary care provider within one week of discharge, repeat BMP.  Follow up with neurology, Dr. Erlinda Hong or Jannifer Franklin, in 6 weeks. Patient should continue medications as prescribed.  Patient should follow a regular diet.     Allergies as of 02/19/2017      Reactions   Novocain [procaine Hcl] Anaphylaxis, Swelling   Codeine Other (See Comments)   Reaction not recalled   Lyrica [pregabalin] Other (See Comments)   Feet became swollen      Medication List    TAKE these medications   alosetron 0.5 MG tablet Commonly known as:  LOTRONEX Take 0.5 mg by mouth 2 (two) times daily as needed (for IBS symptoms).   ALPRAZolam 0.5 MG tablet Commonly known as:  XANAX Take 2 tablets approximately 45 minutes prior to the MRI study, take a third tablet if needed. What changed:  additional instructions   aspirin 81 MG EC tablet Take 1 tablet (81 mg total) by mouth daily. Start taking on:  02/20/2017   butorphanol 10 MG/ML nasal spray Commonly known as:  STADOL USE ONE DOSE EVERY 4 HOURS AS NEEDED FOR HEADACHE What changed:  Another medication with the same name was removed. Continue taking this medication, and follow the directions you see here.   diphenhydrAMINE 25 MG tablet Commonly known as:  BENADRYL Take 25 mg by mouth every 6 (six) hours as needed for allergies.   doxepin 25 MG capsule Commonly known as:  SINEQUAN Take 50 mg by mouth at bedtime.   Erenumab-aooe 70 MG/ML Soaj Commonly known as:  AIMOVIG 140 DOSE Inject 140 mg into the skin every 30 (thirty) days.   feeding supplement (ENSURE ENLIVE) Liqd Take 237 mLs by mouth 2 (two) times daily between meals.   HYDROcodone-acetaminophen 5-325 MG tablet Commonly known as:  NORCO/VICODIN Take 1 tablet by mouth every 6 (six)  hours as needed for moderate pain.   NARCAN 4 MG/0.1ML Liqd nasal spray kit Generic drug:  naloxone Place 1 spray into the nose once as needed (AS DIRECTED).   NASACORT ALLERGY 24HR 55 MCG/ACT Aero nasal inhaler Generic drug:  triamcinolone Place 2 sprays into the nose daily as needed (for allergic symptoms).   nitroGLYCERIN 0.4 MG SL tablet Commonly known as:  NITROSTAT Place 0.4 mg under the tongue every 5 (five) minutes as needed for chest pain.   omeprazole 40 MG capsule Commonly known as:  PRILOSEC Take 40 mg by mouth 2 (two) times daily.   propranolol 40 MG tablet Commonly known as:  INDERAL Take 40 mg by mouth 2 (two) times daily.   rosuvastatin 10 MG tablet Commonly known as:  CRESTOR Take 10 mg by mouth daily.   tiZANidine 4 MG tablet Commonly known as:  ZANAFLEX Take 4 mg by mouth 3 (three) times daily.   vitamin B-12 1000 MCG tablet Commonly known as:  CYANOCOBALAMIN Take 1,000 mcg by mouth daily.      Allergies  Allergen Reactions  . Novocain [Procaine Hcl] Anaphylaxis and Swelling  . Codeine Other (See Comments)    Reaction not recalled  . Lyrica [Pregabalin] Other (See Comments)    Feet became swollen   Follow-up Information    Kathrynn Ducking, MD. Schedule an  appointment as soon as possible for a visit in 6 week(s).   Specialty:  Neurology Contact information: [address] [place] [address] [phone]        Cyndy Freeze, MD. Schedule an appointment as soon as possible for a visit in 1 week(s).   Specialty:  Family Medicine Why:  Hospital follow up Contact information: [place] [address] [phone]        [facility] Follow up on 03/03/2017.   Specialty:  Cardiology Why:  2:00PM, wound check visit Contact information: [address] [place] [phone]           The results of significant diagnostics from this  hospitalization (including imaging, microbiology, ancillary and laboratory) are listed below for reference.    Significant Diagnostic Studies: Dg Chest 2 View  Result Date: 02/17/2017 CLINICAL DATA:  76 y/o  F; intracranial hemorrhage. EXAM: CHEST  2 VIEW COMPARISON:  03/26/2009 chest radiograph FINDINGS: Stable normal cardiac silhouette. Aortic atherosclerosis with calcification. Clear lungs. No pleural effusion or pneumothorax. No acute osseous abnormality is evident. Stable severe discogenic degenerative changes and mild degenerative loss of height of lower thoracic vertebral bodies. IMPRESSION: No acute pulmonary process identified. Electronically Signed   By: Kristine Garbe M.D.   On: 02/17/2017 20:04   Ct Angio Neck W Or Wo Contrast  Result Date: 02/18/2017 CLINICAL DATA:  Stroke follow-up EXAM: CT ANGIOGRAPHY NECK TECHNIQUE: Multidetector CT imaging of the neck was performed using the standard protocol during bolus administration of intravenous contrast. Multiplanar CT image reconstructions and MIPs were obtained to evaluate the vascular anatomy. Carotid stenosis measurements (when applicable) are obtained utilizing NASCET criteria, using the distal internal carotid diameter as the denominator. CONTRAST:  50 mL Isovue 370 COMPARISON:  MRA brain 02/17/2017 FINDINGS: Aortic arch: There is minimal calcific atherosclerosis of the aortic arch. There is no aneurysm, dissection or hemodynamically significant stenosis of the visualized ascending aorta and aortic arch. Conventional 3 vessel aortic branching pattern. The visualized proximal subclavian arteries are normal. Right carotid system: The right common carotid origin is widely patent. There is no common carotid or internal carotid artery dissection or aneurysm. No hemodynamically significant stenosis. Left carotid system: The left common carotid origin is widely patent. There is no common carotid or internal carotid artery dissection or  aneurysm. No hemodynamically significant stenosis. Vertebral arteries: The vertebral system is right dominant. Both vertebral artery origins are normal. Both vertebral arteries are normal to their confluence with the basilar artery. Skeleton: There is no bony spinal canal stenosis. No lytic or blastic lesions. Other neck: The nasopharynx is clear. The oropharynx and hypopharynx are normal. The epiglottis is normal. The supraglottic larynx, glottis and subglottic larynx are normal. No retropharyngeal collection. The parapharyngeal spaces are preserved. The parotid and submandibular glands are normal. No sialolithiasis or salivary ductal dilatation. The thyroid gland is normal. There is no cervical lymphadenopathy. Upper chest: No pneumothorax or pleural effusion. No nodules or masses. Review of the MIP images confirms the above findings IMPRESSION: 1. No emergent large vessel occlusion of the neck. 2. No dissection, aneurysm or hemodynamically significant stenosis of the carotid or vertebral arterial systems. 3. Minimal aortic atherosclerosis (ICD10-I70.0).  Neck Electronically Signed   By: Ulyses Jarred M.D.   On: 02/18/2017 22:29   Mr Jodene Nam Head Wo Contrast  Addendum Date: 02/17/2017   ADDENDUM REPORT: 02/17/2017 19:41 ADDENDUM: Acute findings discussed with and reconfirmed by Dr.Nanavati on  02/17/2017 at 7:30 pm. Electronically Signed   By: Elon Alas M.D.   On: 02/17/2017 19:41   Result Date: 02/17/2017 CLINICAL DATA:  Diplopia for 2 months, headache, confusion and gait instability. EXAM: MRA HEAD WITHOUT CONTRAST TECHNIQUE: Angiographic images of the Circle of Willis were obtained using MRA technique without intravenous contrast. COMPARISON:  MRI of the head February 17, 2017 at 1213 hours FINDINGS: ANTERIOR CIRCULATION: Normal flow related enhancement of the included cervical, petrous, cavernous and supraclinoid internal carotid arteries. Patent anterior communicating artery. RIGHT anterior M3  occlusion. Otherwise patent anterior and middle cerebral arteries. No large vessel occlusion, flow limiting stenosis, aneurysm. POSTERIOR CIRCULATION: Codominant vertebral artery's with focal stenosis bilateral vertebral artery's, at skull base. Basilar artery is patent, with normal flow related enhancement of the main branch vessels. Patent posterior cerebral arteries. Robust bilateral posterior communicating artery's. No large vessel occlusion, aneurysm. ANATOMIC VARIANTS: Hypoplastic bilateral P1 segment. Source images and MIP images were reviewed. IMPRESSION: 1. RIGHT M3 occlusion. 2. Severe stenosis proximal vertebral artery's versus skullbase artifact. Electronically Signed: By: Elon Alas M.D. On: 02/17/2017 19:18   Mr Brain Wo Contrast  Result Date: 02/17/2017  [facility] [address], [place], [place] [address] [phone] NEUROIMAGING REPORT STUDY DATE: 02/17/2017 PATIENT NAME: KAYLENA PACIFICO DOB: 1941/01/04 MRN: [id number] ORDERING CLINICIAN: Dr Jannifer Franklin CLINICAL HISTORY: 63 year patient with headaches and diplopia COMPARISON FILMS:  none EXAM: MRI Brain wo TECHNIQUE: MRI of the brain without contrast was obtained utilizing 5 mm axial slices with T1, T2, T2 flair, T2 star gradient echo and diffusion weighted views.  T1 sagittal and T2 coronal views were obtained.  Study is slightly limited by motion artifacts CONTRAST: none IMAGING SITE: [place] FINDINGS: The brain parenchyma shows a large 4.8 x3 x 4.4 cm diffusion positive lesion in the right posterior frontal region with mixed subacute and a smaller acute infarct component.  The lesion is mostly not dark on the ADC map except for a small posterior medial portion.  There is also areas of microhemorrhages in the lesion.  There is mild surrounding cytotoxic edema but no significant effacement of the ventricle or midline shift is noted.  A smaller remote age infarct is noted in the right cerebellum.  There are  mild changes of chronic microvascular ischemia.  There are mild changes of chronic microvascular ischemia.No abnormal lesions are seen on diffusion-weighted views to suggest acute ischemia. The cortical sulci, fissures and cisterns are normal in size and appearance. Lateral, third and fourth ventricle are normal in size and appearance. No extra-axial fluid collections are seen. No evidence of mass effect or midline shift.  On sagittal views the posterior fossa, pituitary gland and corpus callosum are unremarkable. No evidence of intracranial hemorrhage on gradient-echo views. The orbits and their contents, paranasal sinuses and calvarium are unremarkable.  Intracranial flow voids are present.   Abnormal MRI scan of the brain showing a large  Right posterior frontal mostly subacute infarct with a small acute and hemorrhagic component.  There is surrounding mild cytotoxic edema but without significant midline shift.  There are mild changes of chronic microvascular ischemia and paranasal sinusitis INTERPRETING PHYSICIAN: PRAMOD SETHI, MD Certified in  Neuroimaging by [place] [employer] and [employer]    Microbiology: No results found for this or any previous visit (from the past 240 hour(s)).   Labs: Basic Metabolic Panel: Recent Labs  Lab 02/17/17 1512 02/18/17 0453 02/19/17 1400  NA 137 140  138  K 3.4* 3.3* 3.3*  CL 106 108 103  CO2 _0 GLUCOSE 113* 153* 173*  BUN _1 CREATININE 0.64 0.68 0.63  CALCIUM 9.2 9.0 9.3   Liver Function Tests: Recent Labs  Lab 02/17/17 1512  AST 15  ALT 11*  ALKPHOS 57  BILITOT 1.6*  PROT 6.5  ALBUMIN 4.4   No results for input(s): LIPASE, AMYLASE in the last 168 hours. No results for input(s): AMMONIA in the last 168 hours. CBC: Recent Labs  Lab 02/17/17 1512 02/18/17 0453  WBC 2.6* 3.1*  NEUTROABS 0.9*  --   HGB 9.9* 8.8*  HCT 31.5* 27.8*  MCV 101.9* 101.1*  PLT 75* 74*   Cardiac  Enzymes: No results for input(s): CKTOTAL, CKMB, CKMBINDEX, TROPONINI in the last 168 hours. BNP: BNP (last 3 results) No results for input(s): BNP in the last 8760 hours.  ProBNP (last 3 results) No results for input(s): PROBNP in the last 8760 hours.  CBG: No results for input(s): GLUCAP in the last 168 hours.     Signed:  Cristal Ford  [facility] 02/19/2017, 3:01 PM

## 2017-02-19 NOTE — Progress Notes (Signed)
Pt d/c home, pt stable, d/c instructions done with teach back, pt verbalize understanding. Pt is transported from facility by family

## 2017-02-19 NOTE — Consult Note (Signed)
ELECTROPHYSIOLOGY CONSULT NOTE  Patient ID: Kristin Mendez MRN: [id number], DOB/AGE: [birth year]   Admit date: 02/17/2017 Date of Consult: 02/19/2017  Primary Physician: Cyndy Freeze, MD Primary Cardiologist: none Reason for Consultation: Cryptogenic stroke ; recommendations regarding Implantable Loop Recorder, requested by Dr. Erlinda Hong  History of Present Illness Kristin Mendez was admitted on 02/17/2017 with a CVA.  PMHx noted for HTN, HLD, and myelodysplastic syndrome.  She has been having headaches of late, family has noted some degree of confusion and change in personality in the last weeks.  Imaging demonstrated subacutelargeright anterior frontal lobe infarct.  she has undergone workup for stroke including echocardiogram and carotid angio.  The patient has been monitored on telemetry which has demonstrated sinus rhythm with no arrhythmias.  Inpatient stroke work-up is to be completed with a TEE.   Echocardiogram this admission demonstrated  Study Conclusions - Left ventricle: The cavity size was normal. Systolic function was   vigorous. The estimated ejection fraction was in the range of 65%   to 70%. Wall motion was normal; there were no regional wall   motion abnormalities. Doppler parameters are consistent with   abnormal left ventricular relaxation (grade 1 diastolic   dysfunction). Doppler parameters are consistent with   indeterminate ventricular filling pressure. - Aortic valve: Transvalvular velocity was within the normal range.   There was no stenosis. There was no regurgitation. Valve area   (Vmax): 2.2 cm^2. - Mitral valve: Transvalvular velocity was within the normal range.   There was no evidence for stenosis. There was mild regurgitation. - Left atrium: The atrium was mildly dilated. - Right ventricle: The cavity size was normal. Wall thickness was   normal. Systolic function was normal. - Tricuspid valve: There was mild regurgitation. - Pulmonary arteries:  Systolic pressure was within the normal   range. PA peak pressure: 22 mm Hg (S).  Lab work is reviewed.  Prior to admission, the patient denies chest pain, shortness of breath, dizziness, palpitations, or syncope.  They are recovering from their stroke with disposition plans to pending at discharge.  Dr. Erlinda Hong notes "d/w Dr. Hinton Rao, hematologist at [facility], patient platelet stable, not contraindication for further anticoagulation if cardioembolic source found.  Of note she is getting erythropoietin injection from Dr. Hinton Rao who suspect it may be part of the stroke etiology.  I am not still convinced about that as patient still anemic, not polycythemia. We Kristin Mendez continue plan of TEE and loop recorder to evaluate cardioembolic source"    Past Medical History:  Diagnosis Date  . Abnormality of gait 07/10/2015  . Ankle fracture, left   . Arthritis   . Cervical spondylosis without myelopathy 02/08/2013  . Dyslipidemia   . Headache(784.0)    Chronic daily  . Hypertension   . Myelodysplasia   . Obesity   . Occipital neuralgia    Right  . Peptic ulcer disease   . Vitamin D deficiency      Surgical History:  Past Surgical History:  Procedure Laterality Date  . ABDOMINAL HYSTERECTOMY    . ANKLE FRACTURE SURGERY Left   . APPENDECTOMY    . BREAST LUMPECTOMY    . CATARACT EXTRACTION, BILATERAL    . DILATION AND CURETTAGE OF UTERUS    . LUMBAR LAMINECTOMY     Upper pending  . LUMBAR SPINE SURGERY     5 on occasions  . ROTATOR CUFF REPAIR Left   . THORACIC LAMINECTOMY  9/13  . TONSILLECTOMY    .  TUBAL LIGATION Bilateral      Medications Prior to Admission  Medication Sig Dispense Refill Last Dose  . alosetron (LOTRONEX) 0.5 MG tablet Take 0.5 mg by mouth 2 (two) times daily as needed (for IBS symptoms).    02/16/2017 at Unknown time  . ALPRAZolam (XANAX) 0.5 MG tablet Take 2 tablets approximately 45 minutes prior to the MRI study, take a third tablet if needed.  (Patient taking differently: Take 2 tablets (1 mg) approximately 45 minutes prior to the MRI study, take a third tablet if needed) 3 tablet 0 MRI at MRI  . butorphanol (STADOL) 10 MG/ML nasal spray USE ONE DOSE EVERY 4 HOURS AS NEEDED FOR HEADACHE 2.5 mL 5 Past Week at Unknown time  . diphenhydrAMINE (BENADRYL) 25 MG tablet Take 25 mg by mouth every 6 (six) hours as needed for allergies.   PRN at PRN  . doxepin (SINEQUAN) 25 MG capsule Take 50 mg by mouth at bedtime.    02/16/2017 at pm  . HYDROcodone-acetaminophen (NORCO/VICODIN) 5-325 MG per tablet Take 1 tablet by mouth every 6 (six) hours as needed for moderate pain.   02/16/2017 at pm  . NARCAN 4 MG/0.1ML LIQD nasal spray kit Place 1 spray into the nose once as needed (AS DIRECTED).    PRN at PRN  . nitroGLYCERIN (NITROSTAT) 0.4 MG SL tablet Place 0.4 mg under the tongue every 5 (five) minutes as needed for chest pain.   Past Week at Unknown time  . omeprazole (PRILOSEC) 40 MG capsule Take 40 mg by mouth 2 (two) times daily.   02/17/2017 at am  . propranolol (INDERAL) 40 MG tablet Take 40 mg by mouth 2 (two) times daily.   02/17/2017 at am  . rosuvastatin (CRESTOR) 10 MG tablet Take 10 mg by mouth daily.   02/16/2017 at pm  . tiZANidine (ZANAFLEX) 4 MG tablet Take 4 mg by mouth 3 (three) times daily.    02/17/2017 at am  . triamcinolone (NASACORT ALLERGY 24HR) 55 MCG/ACT AERO nasal inhaler Place 2 sprays into the nose daily as needed (for allergic symptoms).   PRN at PRN  . vitamin B-12 (CYANOCOBALAMIN) 1000 MCG tablet Take 1,000 mcg by mouth daily.   Past Week at Unknown time  . butorphanol (STADOL) 10 MG/ML nasal spray USE ONE DOSE EVERY 4 HOURS AS NEEDED FOR HEADACHE (Patient not taking: Reported on 02/17/2017) 12.5 mL 5 Not Taking at Unknown time  . Erenumab-aooe (AIMOVIG 140 DOSE) 70 MG/ML SOAJ Inject 140 mg into the skin every 30 (thirty) days. 2 pen 3 Not yet at Not yet    Inpatient Medications:  . aspirin EC  81 mg Oral Daily  .  doxepin  50 mg Oral QHS  . enoxaparin (LOVENOX) injection  40 mg Subcutaneous Q24H  . feeding supplement (ENSURE ENLIVE)  237 mL Oral BID BM  . pantoprazole  80 mg Oral Daily  . propranolol  40 mg Oral BID  . rosuvastatin  10 mg Oral q1800  . vitamin B-12  1,000 mcg Oral Daily    Allergies:  Allergies  Allergen Reactions  . Novocain [Procaine Hcl] Anaphylaxis and Swelling  . Codeine Other (See Comments)    Reaction not recalled  . Lyrica [Pregabalin] Other (See Comments)    Feet became swollen    Social History   Socioeconomic History  . Marital status: Married    Spouse name: Not on file  . Number of children: 0  . Years of education: HS  .  Highest education level: Not on file  Social Needs  . Financial resource strain: Not on file  . Food insecurity - worry: Not on file  . Food insecurity - inability: Not on file  . Transportation needs - medical: Not on file  . Transportation needs - non-medical: Not on file  Occupational History  . Occupation: Retired  Tobacco Use  . Smoking status: Never Smoker  . Smokeless tobacco: Never Used  Substance and Sexual Activity  . Alcohol use: No  . Drug use: No  . Sexual activity: Not on file  Other Topics Concern  . Not on file  Social History Narrative   Lives at home w/ her husband   Patient is right handed.   Patient drinks 1 cup caffeine daily.     Family History  Problem Relation Age of Onset  . Alzheimer's disease Mother   . Heart failure Mother   . Heart attack Father   . Diabetes Brother   . Bipolar disorder Brother   . Stroke Maternal Grandfather       Review of Systems: All other systems reviewed and are otherwise negative except as noted above.  Physical Exam: Vitals:   02/18/17 1421 02/18/17 1741 02/19/17 0113 02/19/17 0530  BP: (!) 184/69 (!) 171/65 (!) 153/81 (!) 146/70  Pulse: 88 83 72 76  Resp: _0 Temp: 98.6 F (37 C) 98.3 F (36.8 C) 97.7 F (36.5 C) 98.2 F (36.8 C)  TempSrc:  Oral Oral Oral Oral  SpO2: 98% 98% 98% 98%  Weight:      Height:        GEN- The patient is well appearing, alert and oriented x 3 today.   Head- normocephalic, atraumatic Eyes-  Sclera clear, conjunctiva pink Ears- hearing intact Oropharynx- clear Neck- supple Lungs- CTA b/l, normal work of breathing Heart- RRR, no murmurs, rubs or gallops  GI- soft, NT, ND, obese Extremities- no clubbing, cyanosis, or edema MS- no significant deformity or atrophy Skin- no rash or lesion Psych- euthymic mood, full affect   Labs:   Lab Results  Component Value Date   WBC 3.1 (L) 02/18/2017   HGB 8.8 (L) 02/18/2017   HCT 27.8 (L) 02/18/2017   MCV 101.1 (H) 02/18/2017   PLT 74 (L) 02/18/2017    Recent Labs  Lab 02/17/17 1512 02/18/17 0453  NA 137 140  K 3.4* 3.3*  CL 106 108  CO2 25 26  BUN 9 7  CREATININE 0.64 0.68  CALCIUM 9.2 9.0  PROT 6.5  --   BILITOT 1.6*  --   ALKPHOS 57  --   ALT 11*  --   AST 15  --   GLUCOSE 113* 153*   No results found for: CKTOTAL, CKMB, CKMBINDEX, TROPONINI Lab Results  Component Value Date   CHOL 105 02/18/2017   Lab Results  Component Value Date   HDL 39 (L) 02/18/2017   Lab Results  Component Value Date   LDLCALC 52 02/18/2017   Lab Results  Component Value Date   TRIG 72 02/18/2017   Lab Results  Component Value Date   CHOLHDL 2.7 02/18/2017   No results found for: LDLDIRECT  No results found for: DDIMER   Radiology/Studies:  Dg Chest 2 View Result Date: 02/17/2017 CLINICAL DATA:  76 y/o  F; intracranial hemorrhage. EXAM: CHEST  2 VIEW COMPARISON:  03/26/2009 chest radiograph FINDINGS: Stable normal cardiac silhouette. Aortic atherosclerosis with calcification. Clear lungs. No pleural effusion or pneumothorax.  No acute osseous abnormality is evident. Stable severe discogenic degenerative changes and mild degenerative loss of height of lower thoracic vertebral bodies. IMPRESSION: No acute pulmonary process identified.  Electronically Signed   By: Kristine Garbe M.D.   On: 02/17/2017 20:04   Ct Angio Neck W Or Wo Contrast Result Date: 02/18/2017 CLINICAL DATA:  Stroke follow-up EXAM: CT ANGIOGRAPHY NECK TECHNIQUE: Multidetector CT imaging of the neck was performed using the standard protocol during bolus administration of intravenous contrast. Multiplanar CT image reconstructions and MIPs were obtained to evaluate the vascular anatomy. Carotid stenosis measurements (when applicable) are obtained utilizing NASCET criteria, using the distal internal carotid diameter as the denominator. CONTRAST:  50 mL Isovue 370 COMPARISON:  MRA brain 02/17/2017 FINDINGS: Aortic arch: There is minimal calcific atherosclerosis of the aortic arch. There is no aneurysm, dissection or hemodynamically significant stenosis of the visualized ascending aorta and aortic arch. Conventional 3 vessel aortic branching pattern. The visualized proximal subclavian arteries are normal. Right carotid system: The right common carotid origin is widely patent. There is no common carotid or internal carotid artery dissection or aneurysm. No hemodynamically significant stenosis. Left carotid system: The left common carotid origin is widely patent. There is no common carotid or internal carotid artery dissection or aneurysm. No hemodynamically significant stenosis. Vertebral arteries: The vertebral system is right dominant. Both vertebral artery origins are normal. Both vertebral arteries are normal to their confluence with the basilar artery. Skeleton: There is no bony spinal canal stenosis. No lytic or blastic lesions. Other neck: The nasopharynx is clear. The oropharynx and hypopharynx are normal. The epiglottis is normal. The supraglottic larynx, glottis and subglottic larynx are normal. No retropharyngeal collection. The parapharyngeal spaces are preserved. The parotid and submandibular glands are normal. No sialolithiasis or salivary ductal dilatation.  The thyroid gland is normal. There is no cervical lymphadenopathy. Upper chest: No pneumothorax or pleural effusion. No nodules or masses. Review of the MIP images confirms the above findings IMPRESSION: 1. No emergent large vessel occlusion of the neck. 2. No dissection, aneurysm or hemodynamically significant stenosis of the carotid or vertebral arterial systems. 3. Minimal aortic atherosclerosis (ICD10-I70.0).  Neck Electronically Signed   By: Ulyses Jarred M.D.   On: 02/18/2017 22:29   Mr Jodene Nam Head Wo Contrast Addendum Date: 02/17/2017   ADDENDUM REPORT: 02/17/2017 19:41 ADDENDUM: Acute findings discussed with and reconfirmed by Dr.Nanavati on 02/17/2017 at 7:30 pm. Electronically Signed   By: Elon Alas M.D.   On: 02/17/2017 19:41  Result Date: 02/17/2017 CLINICAL DATA:  Diplopia for 2 months, headache, confusion and gait instability. EXAM: MRA HEAD WITHOUT CONTRAST TECHNIQUE: Angiographic images of the Circle of Willis were obtained using MRA technique without intravenous contrast. COMPARISON:  MRI of the head February 17, 2017 at 1213 hours FINDINGS: ANTERIOR CIRCULATION: Normal flow related enhancement of the included cervical, petrous, cavernous and supraclinoid internal carotid arteries. Patent anterior communicating artery. RIGHT anterior M3 occlusion. Otherwise patent anterior and middle cerebral arteries. No large vessel occlusion, flow limiting stenosis, aneurysm. POSTERIOR CIRCULATION: Codominant vertebral artery's with focal stenosis bilateral vertebral artery's, at skull base. Basilar artery is patent, with normal flow related enhancement of the main branch vessels. Patent posterior cerebral arteries. Robust bilateral posterior communicating artery's. No large vessel occlusion, aneurysm. ANATOMIC VARIANTS: Hypoplastic bilateral P1 segment. Source images and MIP images were reviewed. IMPRESSION: 1. RIGHT M3 occlusion. 2. Severe stenosis proximal vertebral artery's versus skullbase  artifact. Electronically Signed: By: Elon Alas M.D. On: 02/17/2017 19:18  Mr Brain Wo Contrast Result Date: 02/17/2017  [facility] [address], [place], [place] [address] [phone] NEUROIMAGING REPORT STUDY DATE: 02/17/2017 PATIENT NAME: Kristin Mendez DOB: [birth year] MRN: [id number] ORDERING CLINICIAN: Dr Jannifer Franklin CLINICAL HISTORY: 74 year patient with headaches and diplopia COMPARISON FILMS:  none EXAM: MRI Brain wo TECHNIQUE: MRI of the brain without contrast was obtained utilizing 5 mm axial slices with T1, T2, T2 flair, T2 star gradient echo and diffusion weighted views.  T1 sagittal and T2 coronal views were obtained.  Study is slightly limited by motion artifacts CONTRAST: none IMAGING SITE: [place] FINDINGS: The brain parenchyma shows a large 4.8 x3 x 4.4 cm diffusion positive lesion in the right posterior frontal region with mixed subacute and a smaller acute infarct component.  The lesion is mostly not dark on the ADC map except for a small posterior medial portion.  There is also areas of microhemorrhages in the lesion.  There is mild surrounding cytotoxic edema but no significant effacement of the ventricle or midline shift is noted.  A smaller remote age infarct is noted in the right cerebellum.  There are mild changes of chronic microvascular ischemia.  There are mild changes of chronic microvascular ischemia.No abnormal lesions are seen on diffusion-weighted views to suggest acute ischemia. The cortical sulci, fissures and cisterns are normal in size and appearance. Lateral, third and fourth ventricle are normal in size and appearance. No extra-axial fluid collections are seen. No evidence of mass effect or midline shift.  On sagittal views the posterior fossa, pituitary gland and corpus callosum are unremarkable. No evidence of intracranial hemorrhage on gradient-echo views. The orbits and their contents, paranasal sinuses and calvarium are  unremarkable.  Intracranial flow voids are present.   Abnormal MRI scan of the brain showing a large  Right posterior frontal mostly subacute infarct with a small acute and hemorrhagic component.  There is surrounding mild cytotoxic edema but without significant midline shift.  There are mild changes of chronic microvascular ischemia and paranasal sinusitis INTERPRETING PHYSICIAN: PRAMOD SETHI, MD Certified in  Neuroimaging by [place] [employer] and [place] for Neurological Subspecialities    12-lead ECG SR, one care everywhere EKG image not available, interpr. SR All prior EKG's in EPIC reviewed with no documented atrial fibrillation  Telemetry SR, nocturnal bradycardia  Assessment and Plan:  1. Cryptogenic stroke The patient presents with cryptogenic stroke.  The patient has a TEE planned for this AM.  Dr. Curt Bears spoke at length with the patient about monitoring for afib with either a 30 day event monitor or an implantable loop recorder.  Risks, benefits, and alteratives to implantable loop recorder were discussed with the patient today.   At this time, the patient is very clear in their decision to proceed with implantable loop recorder.   Wound care was reviewed with the patient (keep incision clean and dry for 3 days).  Wound check Kristin Mendez scheduled for the patient  Please call with questions.   Kristin Jamaica, PA-C 02/19/2017  I have seen and examined this patient with Tommye Standard.  Agree with above, note added to reflect my findings.  On exam, RRR, no murmurs, lungs clear. Had cryptogenic stroke. No cause found. Unable to do TEE. Deveion Denz plan for LINQ implant. Risks and benefits discussed and include bleeding and infection. The patient understands the risks and has agreed to the procedure.    Dalia Jollie M. Ludie Pavlik MD 02/19/2017 11:20 AM

## 2017-02-19 NOTE — Progress Notes (Signed)
Physical Therapy Treatment Patient Details Name: Kristin Mendez MRN: [id number] DOB: [birth year] Today's Date: 02/19/2017    History of Present Illness 76 y.o. female with PMH of hypertension, hyperlipidemia, and myelodysplasia presenting with chronic headaches and a month history of intermittent diplopia. MRI + subacute large right anterior frontal lobe infarct.    PT Comments    Pt s/p TEE today and is ready for D/c home. Pt supervision for bed mobilities, transfers and ambulation without AD. Pt performed DGI during session and scored 17/21 which is predictive of high fall risk. Pt to work with outpatient neurological rehab at discharge where she can work on improving higher level balance activities.    Follow Up Recommendations  Outpatient PT;Supervision/Assistance - 24 hour     Equipment Recommendations  None recommended by PT    Recommendations for Other Services       Precautions / Restrictions Precautions Precautions: None Restrictions Weight Bearing Restrictions: No    Mobility  Bed Mobility Overal bed mobility: Modified Independent                Transfers Overall transfer level: Modified independent                  Ambulation/Gait Ambulation/Gait assistance: Supervision Ambulation Distance (Feet): 300 Feet Assistive device: None Gait Pattern/deviations: Decreased dorsiflexion - left;Shuffle;Trunk flexed;Step-through pattern Gait velocity: slowed Gait velocity interpretation: Below normal speed for age/gender General Gait Details: supervision for safety, decreased foot clerance on L LE with gait progression,    Stairs Stairs: Yes   Stair Management: One rail Left;Forwards;Alternating pattern Number of Stairs: 10 General stair comments: min guard for safety strong, steady ascent/descent   Modified Rankin (Stroke Patients Only) Modified Rankin (Stroke Patients Only) Pre-Morbid Rankin Score: No symptoms Modified Rankin: Slight  disability     Balance                                 Standardized Balance Assessment Standardized Balance Assessment : Dynamic Gait Index   Dynamic Gait Index Level Surface: Normal Change in Gait Speed: Moderate Impairment Gait with Horizontal Head Turns: Mild Impairment Gait with Vertical Head Turns: Mild Impairment Gait and Pivot Turn: Normal Step Over Obstacle: Mild Impairment Step Around Obstacles: Mild Impairment Steps: Mild Impairment Total Score: 17      Cognition Arousal/Alertness: Awake/alert Behavior During Therapy: WFL for tasks assessed/performed Overall Cognitive Status: Impaired/Different from baseline Area of Impairment: Attention;Memory;Awareness;Problem solving                   Current Attention Level: Sustained Memory: Decreased short-term memory     Awareness: Emergent Problem Solving: Slow processing;Difficulty sequencing;Requires verbal cues           General Comments General comments (skin integrity, edema, etc.): Husband present       Pertinent Vitals/Pain Pain Assessment: Faces Faces Pain Scale: Hurts little more Pain Location: headache Pain Descriptors / Indicators: Headache Pain Intervention(s): Limited activity within patient's tolerance;Monitored during session    [place] expects to be discharged to:: Private residence Living Arrangements: Spouse/significant other Available Help at Discharge: Family Type of Home: House Home Access: Stairs to enter Entrance Stairs-Rails: Right Home Layout: Multi-level Home Equipment: Environmental consultant - 2 wheels;Cane - single point;Crutches;Shower seat - built in;Bedside commode      Prior Function Level of Independence: Independent      Comments: enjoys reading; drives occasionally   PT Goals (current  goals can now be found in the care plan section) Acute Rehab PT Goals Patient Stated Goal: to go home PT Goal Formulation: With patient Time For Goal  Achievement: 03/04/17 Potential to Achieve Goals: Good Progress towards PT goals: Progressing toward goals    Frequency    Min 4X/week      PT Plan Current plan remains appropriate       AM-PAC PT "6 Clicks" Daily Activity  Outcome Measure  Difficulty turning over in bed (including adjusting bedclothes, sheets and blankets)?: A Little Difficulty moving from lying on back to sitting on the side of the bed? : A Little Difficulty sitting down on and standing up from a chair with arms (e.g., wheelchair, bedside commode, etc,.)?: A Little Help needed moving to and from a bed to chair (including a wheelchair)?: None Help needed walking in hospital room?: None Help needed climbing 3-5 steps with a railing? : None 6 Click Score: 21    End of Session Equipment Utilized During Treatment: Gait belt Activity Tolerance: Patient tolerated treatment well Patient left: in bed;with call bell/phone within reach;with nursing/sitter in room;with family/visitor present Nurse Communication: Mobility status PT Visit Diagnosis: Other abnormalities of gait and mobility (R26.89);Other symptoms and signs involving the nervous system (Y07.371)     Time: 0626-9485 PT Time Calculation (min) (ACUTE ONLY): 20 min  Charges:  $Gait Training: 8-22 mins                    G Codes:       Deaaron Fulghum B. Migdalia Dk PT, DPT Acute Rehabilitation  [phone] Pager [phone]     [place] 02/19/2017, 3:34 PM

## 2017-02-19 NOTE — Care Management Note (Signed)
Case Management Note  Patient Details  Name: Kristin Mendez MRN: [id number] Date of Birth: 07-Mar-1940  Subjective/Objective:    Pt admitted with CVA  PTA pt independent from home with spouse.  Pt/husband in agreement with outpt PT and OT (neuro) at [facility].  CM submitted referral via Epic.  Pts husband will provide 24 hour supervision at discharge as recommended.  CM will continue to follow for discharge needs   Expected Discharge Date:                  Expected Discharge Plan:  Home/Self Care  In-House Referral:     Discharge planning Services  CM Consult  Post Acute Care Choice:    Choice offered to:     DME Arranged:    DME Agency:     HH Arranged:    HH Agency:     Status of Service:     If discussed at [employer] of Stay Meetings, dates discussed:    Additional Comments:  Maryclare Labrador, RN 02/19/2017, 1:49 PM

## 2017-02-19 NOTE — Progress Notes (Signed)
NEUROHOSPITALISTS STROKE TEAM - DAILY PROGRESS NOTE   SUBJECTIVE (INTERVAL HISTORY) Husband and son are at the bedside. TEE this am not successful due to high tolerance to propofol and hx of esophagus stricture. However, able to get loop recorder done.   OBJECTIVE Lab Results: CBC:  Recent Labs  Lab 02/17/17 1512 02/18/17 0453  WBC 2.6* 3.1*  HGB 9.9* 8.8*  HCT 31.5* 27.8*  MCV 101.9* 101.1*  PLT 75* 74*   BMP: Recent Labs  Lab 02/17/17 1512 02/18/17 0453 02/19/17 1400  NA 137 140 138  K 3.4* 3.3* 3.3*  CL 106 108 103  CO2 25 26 28   GLUCOSE 113* 153* 173*  BUN 9 7 7   CREATININE 0.64 0.68 0.63  CALCIUM 9.2 9.0 9.3   Liver Function Tests:  Recent Labs  Lab 02/17/17 1512  AST 15  ALT 11*  ALKPHOS 57  BILITOT 1.6*  PROT 6.5  ALBUMIN 4.4   Coagulation Studies:  Recent Labs    02/17/17 1512 02/19/17 1400  APTT 30  --   INR 1.01 1.04   Urine Drug Screen:     Component Value Date/Time   LABOPIA POSITIVE (A) 02/17/2017 1429   COCAINSCRNUR NONE DETECTED 02/17/2017 1429   LABBENZ POSITIVE (A) 02/17/2017 1429   AMPHETMU NONE DETECTED 02/17/2017 1429   THCU NONE DETECTED 02/17/2017 1429   LABBARB NONE DETECTED 02/17/2017 1429     PHYSICAL EXAM Temp:  [97.5 F (36.4 C)-98.6 F (37 C)] 98.2 F (36.8 C) (12/21 1051) Pulse Rate:  [72-88] 86 (12/21 1051) Resp:  [17-30] 20 (12/21 1051) BP: (119-251)/(54-115) 148/54 (12/21 1051) SpO2:  [96 %-100 %] 98 % (12/21 1051) General - Well nourished, well developed, in no apparent distress Respiratory - Lungs clear bilaterally. No wheezing. Cardiovascular - Regular rate and rhythm  Neurologic Examination: Mental Status: Alert, oriented x 3, thought content appropriate. Speech fluent without evidence of aphasia. Able to follow commands without difficulty. Has difficulty interpreting abstract phrases. Fails a simple go-no-go test. Mild impulsivity in responding  when asked to follow a series of 3-step directional commands.  Cranial Nerves: II: Visual fields grossly normal,  III,IV, VI: ptosis not present, extra-ocular motions intact bilaterally pupils equal, round, reactive to light V,VII: smile symmetric, facial light touch sensation normal bilaterally VIII: hearing grossly normal bilaterally IX,X: uvula rises symmetrically XI: bilateral shoulder shrug XII: midline tongue extension Motor: Right :Upper extremity 5/5Left: Upper extremity 5/5 Lower extremity 5/5Lower extremity 5/5 Tone and bulk:normal tone throughout; no atrophy noted Sensory: Pinprick and light touch intact throughout, bilaterally Deep Tendon Reflexes: 1+ in bilateral upper extremities and 0 in bilateral lower extremities  Plantars: Right: downgoingLeft: downgoing Cerebellar: Normal finger-to-nose, normal rapid alternating movements and normal heel-to-shin test  IMAGING: I have personally reviewed the radiological images below and agree with the radiology interpretations.  MRA Head Wo Contrast Result Date: 02/17/2017 IMPRESSION: 1. RIGHT M3 occlusion. 2. Severe stenosis proximal vertebral artery's versus skullbase artifact.   Mr Brain Wo Contrast 02/17/2017 Abnormal MRI scan of the brain showing a large  Right posterior frontal mostly subacute infarct with a small acute and hemorrhagic component.  There is surrounding mild cytotoxic edema but without significant midline shift.  There are mild changes of chronic microvascular ischemia and paranasal sinusitis   Echocardiogram:  Study Conclusions - Left ventricle: The cavity size was normal. Systolic function was   vigorous. The estimated ejection fraction was in the range of 65%   to 70%. Wall motion was normal; there were  no regional wall   motion abnormalities. Doppler parameters are consistent  with   abnormal left ventricular relaxation (grade 1 diastolic   dysfunction). Doppler parameters are consistent with   indeterminate ventricular filling pressure. - Aortic valve: Transvalvular velocity was within the normal range.   There was no stenosis. There was no regurgitation. Valve area   (Vmax): 2.2 cm^2. - Mitral valve: Transvalvular velocity was within the normal range.   There was no evidence for stenosis. There was mild regurgitation. - Left atrium: The atrium was mildly dilated. - Right ventricle: The cavity size was normal. Wall thickness was   normal. Systolic function was normal. - Tricuspid valve: There was mild regurgitation. - Pulmonary arteries: Systolic pressure was within the normal   range. PA peak pressure: 22 mm Hg (S  B/L LE Duplex:      negative for DVT.                                    Ct Angio Neck W Or Wo Contrast 02/18/2017 IMPRESSION: 1. No emergent large vessel occlusion of the neck. 2. No dissection, aneurysm or hemodynamically significant stenosis of the carotid or vertebral arterial systems. 3. Minimal aortic atherosclerosis (ICD10-I70.0).       ASSESSMENT: Ms. Kristin Mendez is a 76 y.o. female with PMH of hypertension, hyperlipidemia, and myelodysplasia presenting with chronic headaches and a month history of intermittent diplopia in her left eye. MRI showed subacutelargeright anterior frontal lobe infarct.   STROKE: right posterior frontal region acute infarct likely cardioembolic Resultant Symptoms: none Stroke Risk Factors: hyperlipidemia and hypertension Other Stroke Risk Factors: Advanced age, Family hx stroke, Migraines PLAN: Continue Aspirin/ Statin Frequent neuro checks Telemetry monitoring PT/OT/SLP Loop Recorder Placed Ongoing aggressive stroke risk factor management Patient counseled to be compliant with her antithrombotic medications Patient counseled on Lifestyle modifications including, Diet, Exercise, and Stress Follow  up with Dr. Jannifer Franklin at [facility]rology in 6 weeks  Myelodysplasia  Pancytopenia, stable  Platelet stable at 75->74  Discussed with Dr. Hinton Rao and no contraindication for [facility] if needed in the future  Will continue to follow up with Dr. Hinton Rao closely   Hx of migraines Continue home dose of Stadol We will continue to monitor closely followup with Dr. Jannifer Franklin at [facility]  Episodic vertical diplopia  Etiology unclear  Not able to explained by current stroke  No recurrence during hospitalization  Follow up with [facility]. Jannifer Franklin at [facility]  HYPERTENSION: Stable, some elevated blood pressures overnight noted Long term BP goal normotensive. Home Meds: Inderal  HYPERLIPIDEMIA:Home Meds: Crestor 10 LDL 52, goal < 70 Continued on Crestor 10 mg daily Continue statin at discharge  Renie Ora Stroke Neurology Team 02/19/2017 3:16 PM  ATTENDING NOTE: I reviewed above note and agree with the assessment and plan. I have made any additions or clarifications directly to the above note. Pt was seen and examined.   76 year old female with history of hypertension, hyperlipidemia, migraine headache, episodic vertical diplopia for the last 1 month, chronic myelodysplasia with pancytopenia admitted for abnormal MRI.  MRI as outpatient showed right MCA infarct with MRA showed right M3 occlusion, concerning for cardioembolic source.  LE venous Doppler negative for DVT, EF 65-70%.  CTA neck neg, LDL 52 and A1c 5.1.  Still has pancytopenia with WBC 2.6, hemoglobin 9.9 and platelets 75.  Discussed with Dr. Hinton Rao, hematologist at [facility], patient platelet stable, not  contraindication for further anticoagulation if cardioembolic source found.  Of note she is getting erythropoietin injection from Dr. Hinton Rao who suspect it may be part of the stroke etiology.  I am not still convinced about that as patient still anemic, not polycythemia.  Not able to perform TEE due to hx of esophagus  stricture and high tolerance to propofol but loop recorder placed to rule out afib.  Pt will continue ASA and statin. Follow up with Dr. Jannifer Franklin and Dr. Hinton Rao as outpt.  Neurology will sign off. Please call with questions. Pt will follow up with Dr. Jannifer Franklin at [facility] in about 6 weeks. Thanks for the consult.  Rosalin Hawking, MD PhD Stroke Neurology 02/19/2017 3:16 PM     To contact Stroke Continuity provider, please refer to [email or web address] After hours, contact General Neurology

## 2017-02-19 NOTE — Consult Note (Signed)
[facility] CM Primary Care Navigator  02/19/2017  Kristin Mendez 1940/05/21 [id number]   Met with patient and husband Doren Custard) at the bedside to identify possible discharge needs. Husband reports that patient washaving several episodes of double vision and with new memory issues/ confusion that had led to this admission. Patient endorsesDr. Cyndy Freeze [facility] the primary care provider.   [facility] [place].   Patientstatesthat husband helps inmanaging her medications at home with use of "pill box" system filled weekly.  Patient's husbandprovidestransportation to herdoctors'appointments.  Patient states that husband is the primary caregiver at home.  Discharge plan is home with Outpatient therapy as recommended by therapist.   Patientand husband voiced understanding to call primary care provider's office when she returns home, for a post discharge follow-up appointment within a week or sooner if needed.Patient letter (with PCP's contact number) was provided asa reminder.  Explained to patient and husband about [facility] CM services available for health management at home but denied any needs or concerns as this time. Patientvoiced understanding to seek referral from primary care provider to [facility] care management ifnecessaryand deemed appropriate for services in the future.  [facility] care management information provided for future needs thatshe may have.  Patient however, had verbally agreed forEMMI strokecalls tofollowupwith her at home.  Notedorder for EMMI stroke calls after dischargealready in place.   For additional questions please contact:  Edwena Felty A. Majorie Santee, BSN, RN-BC [facility] PRIMARY CARE Navigator Cell: [phone]

## 2017-02-19 NOTE — Interval H&P Note (Signed)
History and Physical Interval Note:  02/19/2017 8:11 AM  Kristin Mendez  has presented today for surgery, with the diagnosis of STROKE  The various methods of treatment have been discussed with the patient and family. After consideration of risks, benefits and other options for treatment, the patient has consented to  Procedure(s): TRANSESOPHAGEAL ECHOCARDIOGRAM (TEE) (N/A) as a surgical intervention .  The patient's history has been reviewed, patient examined, no change in status, stable for surgery.  I have reviewed the patient's chart and labs.  Questions were answered to the patient's satisfaction.     [name]

## 2017-02-19 NOTE — Op Note (Signed)
Procedure aborted PT received 62,5 fentanyl and 7mg  Versed  Could not receive Lidocaine/Cetacaine due to allergy. Pt would not swallow TEE probe.  With history of mutiple esophageal dilations (done in [place]) and plts of 74,000 did not want to risk complication  Discussed with attending physician Will need 1.  Records from [place] and 2. Anesthesia assist prior to considering again.

## 2017-02-19 NOTE — Discharge Instructions (Signed)
Keep incision clean and dry (no showers) for 3 days. You can remove outer dressing tomorrow. Leave steri-strips (little pieces of tape) on until seen in the office for wound check appointment, or until they fall off Call the office [phone]) for redness, drainage, swelling, or fever.

## 2017-02-19 NOTE — Progress Notes (Signed)
PT Cancellation Note  Patient Details Name: Kristin Mendez MRN: [id number] DOB: 1941-03-01   Cancelled Treatment:    Reason Eval/Treat Not Completed: (P) Patient at procedure or test/unavailable PT will follow back this afternoon as able.  Jahnessa Vanduyn B. Migdalia Dk PT, DPT Acute Rehabilitation  [phone] Pager [phone]    [place] 02/19/2017, 8:06 AM

## 2017-02-22 ENCOUNTER — Telehealth: Payer: Self-pay | Admitting: Neurology

## 2017-02-22 NOTE — Telephone Encounter (Signed)
The husband called, the patient just had a stroke was in the hospital.  The patient is to come in on 24 February 2017 for training for the Aimovig shot.  Okay to continue with this.  We will need to get a revisit in about 6 weeks.

## 2017-02-24 ENCOUNTER — Ambulatory Visit (INDEPENDENT_AMBULATORY_CARE_PROVIDER_SITE_OTHER): Payer: Self-pay | Admitting: *Deleted

## 2017-02-24 DIAGNOSIS — G43019 Migraine without aura, intractable, without status migrainosus: Secondary | ICD-10-CM

## 2017-02-24 DIAGNOSIS — Z0289 Encounter for other administrative examinations: Secondary | ICD-10-CM

## 2017-02-24 NOTE — Progress Notes (Signed)
Patient came to office for injection training for rx aimovig. Husband was with her. Provided injection training. Pt tolerated well. Pt brought medication from pharmacy. Verified she kept medication in fridge prior to bringing to the office. Aimovig 140mg  dose (2-70mg /mL INJ). Directions: Inject 140mg  into the skin every 30 days. Nitro: [id number]. Lot: [id number]. Expiration: 04/2018.  Pt injected into abdomen on right and left side. Provided patient with patient instruction manual for Aimovig.

## 2017-02-24 NOTE — Telephone Encounter (Signed)
Will schedule f/u when pt comes today for inj training for aimovig

## 2017-02-24 NOTE — Telephone Encounter (Signed)
Scheduled pt f/u for 04/14/17 at 12pm, check in 1130am

## 2017-03-03 ENCOUNTER — Ambulatory Visit (INDEPENDENT_AMBULATORY_CARE_PROVIDER_SITE_OTHER): Payer: Self-pay | Admitting: *Deleted

## 2017-03-03 DIAGNOSIS — I639 Cerebral infarction, unspecified: Secondary | ICD-10-CM | POA: Diagnosis not present

## 2017-03-03 DIAGNOSIS — Z9189 Other specified personal risk factors, not elsewhere classified: Secondary | ICD-10-CM | POA: Diagnosis not present

## 2017-03-03 LAB — CUP PACEART INCLINIC DEVICE CHECK
Date Time Interrogation Session: 20190102150643
Implantable Pulse Generator Implant Date: 20181221

## 2017-03-03 NOTE — Progress Notes (Signed)
Wound check appointment. Steri-strips previously removed by patient. Wound without redness or edema. Incision edges approximated, wound well healed. Normal device function. Battery status: good. R-waves 0.24mV. No symptom, tachy, pause, brady, or AF episodes. Pause and brady detection reprogrammed off (no hx of syncope). Patient and husband educated about wound care and Carelink monitor. Monthly summary reports and ROV with WC PRN.

## 2017-03-10 DIAGNOSIS — D469 Myelodysplastic syndrome, unspecified: Secondary | ICD-10-CM | POA: Diagnosis not present

## 2017-03-10 DIAGNOSIS — D61818 Other pancytopenia: Secondary | ICD-10-CM | POA: Diagnosis not present

## 2017-03-10 DIAGNOSIS — D461 Refractory anemia with ring sideroblasts: Secondary | ICD-10-CM | POA: Diagnosis not present

## 2017-03-22 ENCOUNTER — Ambulatory Visit (INDEPENDENT_AMBULATORY_CARE_PROVIDER_SITE_OTHER): Payer: Medicare Other | Admitting: *Deleted

## 2017-03-22 DIAGNOSIS — I639 Cerebral infarction, unspecified: Secondary | ICD-10-CM

## 2017-03-22 NOTE — Progress Notes (Signed)
Carelink Summary Report / Loop Recorder 

## 2017-03-29 LAB — CUP PACEART REMOTE DEVICE CHECK
MDC IDC PG IMPLANT DT: 20181221
MDC IDC SESS DTM: 20190120163728

## 2017-04-08 DIAGNOSIS — H6123 Impacted cerumen, bilateral: Secondary | ICD-10-CM | POA: Diagnosis not present

## 2017-04-08 DIAGNOSIS — J069 Acute upper respiratory infection, unspecified: Secondary | ICD-10-CM | POA: Diagnosis not present

## 2017-04-14 ENCOUNTER — Ambulatory Visit: Payer: Medicare Other | Admitting: Neurology

## 2017-04-14 ENCOUNTER — Encounter: Payer: Self-pay | Admitting: Neurology

## 2017-04-14 VITALS — BP 146/63 | HR 71 | Ht <= 58 in | Wt 130.0 lb

## 2017-04-14 DIAGNOSIS — I63411 Cerebral infarction due to embolism of right middle cerebral artery: Secondary | ICD-10-CM

## 2017-04-14 DIAGNOSIS — G43019 Migraine without aura, intractable, without status migrainosus: Secondary | ICD-10-CM | POA: Diagnosis not present

## 2017-04-14 DIAGNOSIS — R05 Cough: Secondary | ICD-10-CM | POA: Diagnosis not present

## 2017-04-14 DIAGNOSIS — J181 Lobar pneumonia, unspecified organism: Secondary | ICD-10-CM | POA: Diagnosis not present

## 2017-04-14 MED ORDER — ONDANSETRON HCL 4 MG PO TABS: 4.0000 mg | ORAL_TABLET | Freq: Three times a day (TID) | ORAL | 3 refills | Status: AC | PRN

## 2017-04-14 NOTE — Progress Notes (Signed)
Reason for visit: Chronic daily headache, stroke  Kristin Mendez is an 77 y.o. female  History of present illness:  Kristin Mendez is a 77 year old right-handed white female with a history of chronic daily headaches.  The patient has been on Aimovig over the last 2 months, so far she has gained essentially no benefit with medication.  She was seen on 03 February 2017 with a new problem with double vision.  The patient was set up for MRI of the brain, this was done on 18 February 2017, this showed evidence of a moderate sized right frontal stroke with petechial hemorrhages.  The patient was admitted to the hospital and underwent a workup that did not reveal the actual source of the stroke.  A cardiac source was suspected.  The patient has a loop recorder in place currently.  The patient indicates that her headaches have worsened since the stroke and she has increased fatigue.  She has had some mild problems with memory but no other physical deficits were noted from the stroke.  The double vision has gone away.  She returns for an evaluation.  Past Medical History:  Diagnosis Date  . Abnormality of gait 07/10/2015  . Ankle fracture, left   . Arthritis   . Cervical spondylosis without myelopathy 02/08/2013  . Dyslipidemia   . Headache(784.0)    Chronic daily  . Hypertension   . Myelodysplasia   . Obesity   . Occipital neuralgia    Right  . Peptic ulcer disease   . Vitamin D deficiency     Past Surgical History:  Procedure Laterality Date  . ABDOMINAL HYSTERECTOMY    . ANKLE FRACTURE SURGERY Left   . APPENDECTOMY    . BREAST LUMPECTOMY    . CATARACT EXTRACTION, BILATERAL    . DILATION AND CURETTAGE OF UTERUS    . LOOP RECORDER INSERTION N/A 02/19/2017   Procedure: LOOP RECORDER INSERTION;  Surgeon: Constance Haw, MD;  Location: [facility];  Service: Cardiovascular;  Laterality: N/A;  . LUMBAR LAMINECTOMY     Upper pending  . LUMBAR SPINE SURGERY     5 on occasions  .  ROTATOR CUFF REPAIR Left   . THORACIC LAMINECTOMY  9/13  . TONSILLECTOMY    . TUBAL LIGATION Bilateral     Family History  Problem Relation Age of Onset  . Alzheimer's disease Mother   . Heart failure Mother   . Heart attack Father   . Diabetes Brother   . Bipolar disorder Brother   . Stroke Maternal Grandfather     Social history:  reports that  has never smoked. she has never used smokeless tobacco. She reports that she does not drink alcohol or use drugs.    Allergies  Allergen Reactions  . Novocain [Procaine Hcl] Anaphylaxis and Swelling  . Codeine Other (See Comments)    Reaction not recalled  . Lyrica [Pregabalin] Other (See Comments)    Feet became swollen    Medications:  Prior to Admission medications   Medication Sig Start Date End Date Taking? Authorizing Provider  alosetron (LOTRONEX) 0.5 MG tablet Take 0.5 mg by mouth 2 (two) times daily as needed (for IBS symptoms).    Yes [provider]  ALPRAZolam Duanne Moron) 0.5 MG tablet Take 2 tablets approximately 45 minutes prior to the MRI study, take a third tablet if needed. Patient taking differently: Take 2 tablets (1 mg) approximately 45 minutes prior to the MRI study, take a third  tablet if needed 02/03/17  Yes Kathrynn Ducking, MD  aspirin 81 MG EC tablet Take 1 tablet (81 mg total) by mouth daily. 02/20/17  Yes Mikhail, Maryann, DO  butorphanol (STADOL) 10 MG/ML nasal spray USE ONE DOSE EVERY 4 HOURS AS NEEDED FOR HEADACHE 11/25/16  Yes Kathrynn Ducking, MD  diphenhydrAMINE (BENADRYL) 25 MG tablet Take 25 mg by mouth every 6 (six) hours as needed for allergies.   Yes [provider]  doxepin (SINEQUAN) 25 MG capsule Take 50 mg by mouth at bedtime.    Yes [provider]  Erenumab-aooe (AIMOVIG 140 DOSE) 70 MG/ML SOAJ Inject 140 mg into the skin every 30 (thirty) days. 02/03/17  Yes Kathrynn Ducking, MD  feeding supplement, ENSURE ENLIVE, (ENSURE ENLIVE) LIQD Take 237 mLs by mouth 2 (two)  times daily between meals. 02/19/17  Yes Mikhail, Velta Addison, DO  HYDROcodone-acetaminophen (NORCO/VICODIN) 5-325 MG per tablet Take 1 tablet by mouth every 6 (six) hours as needed for moderate pain.   Yes [provider]  NARCAN 4 MG/0.1ML LIQD nasal spray kit Place 1 spray into the nose once as needed (AS DIRECTED).  12/03/16  Yes [provider]  nitroGLYCERIN (NITROSTAT) 0.4 MG SL tablet Place 0.4 mg under the tongue every 5 (five) minutes as needed for chest pain.   Yes [provider]  omeprazole (PRILOSEC) 40 MG capsule Take 40 mg by mouth 2 (two) times daily. 12/01/16  Yes [provider]  propranolol (INDERAL) 40 MG tablet Take 40 mg by mouth 2 (two) times daily.   Yes [provider]  rosuvastatin (CRESTOR) 10 MG tablet Take 10 mg by mouth daily.   Yes [provider]  tiZANidine (ZANAFLEX) 4 MG tablet Take 4 mg by mouth 3 (three) times daily.    Yes [provider]  triamcinolone (NASACORT ALLERGY 24HR) 55 MCG/ACT AERO nasal inhaler Place 2 sprays into the nose daily as needed (for allergic symptoms).   Yes [provider]  vitamin B-12 (CYANOCOBALAMIN) 1000 MCG tablet Take 1,000 mcg by mouth daily.   Yes [provider]    ROS:  Out of a complete 14 system review of symptoms, the patient complains only of the following symptoms, and all other reviewed systems are negative.  Cough Diarrhea, nausea Memory loss, headaches  Blood pressure (!) 146/63, pulse 71, height 4' 9"  (1.448 m), weight 130 lb (59 kg).  Physical Exam  General: The patient is alert and cooperative at the time of the examination.  Skin: No significant peripheral edema is noted.   Neurologic Exam  Mental status: The patient is alert and oriented x 3 at the time of the examination. The patient has apparent normal recent and remote memory, with an apparently normal attention span and concentration ability.   Cranial nerves: Facial  symmetry is present. Speech is normal, no aphasia or dysarthria is noted. Extraocular movements are full. Visual fields are full.  Motor: The patient has good strength in all 4 extremities.  Sensory examination: Soft touch sensation is symmetric on the face, arms, and legs.  Coordination: The patient has good finger-nose-finger and heel-to-shin bilaterally.  Gait and station: The patient has a normal gait. Tandem gait is unsteady. Romberg is positive, the patient falls backwards. No drift is seen.  Reflexes: Deep tendon reflexes are symmetric.   MRA Head Wo Contrast Result Date: 02/17/2017 IMPRESSION: 1. RIGHT M3 occlusion. 2. Severe stenosis proximal vertebral artery's versus skullbase artifact.   Mr Brain Wo Contrast  02/17/2017 Abnormal MRI scan of the brain showing a large  Right posterior frontal mostly subacute infarct with a small acute and hemorrhagic component.  There is surrounding mild cytotoxic edema but without significant midline shift.  There are mild changes of chronic microvascular ischemia and paranasal sinusitis   Echocardiogram:  Study Conclusions - Left ventricle: The cavity size was normal. Systolic function was vigorous. The estimated ejection fraction was in the range of 65% to 70%. Wall motion was normal; there were no regional wall motion abnormalities. Doppler parameters are consistent with abnormal left ventricular relaxation (grade 1 diastolic dysfunction). Doppler parameters are consistent with indeterminate ventricular filling pressure. - Aortic valve: Transvalvular velocity was within the normal range. There was no stenosis. There was no regurgitation. Valve area (Vmax): 2.2 cm^2. - Mitral valve: Transvalvular velocity was within the normal range. There was no evidence for stenosis. There was mild regurgitation. - Left atrium: The atrium was mildly dilated. - Right ventricle: The cavity size was normal. Wall thickness  was normal. Systolic function was normal. - Tricuspid valve: There was mild regurgitation. - Pulmonary arteries: Systolic pressure was within the normal range. PA peak pressure: 22 mm Hg (S  B/L LE Duplex:      negative for DVT.                                    Ct Angio Neck W Or Wo Contrast 02/18/2017 IMPRESSION: 1. No emergent large vessel occlusion of the neck. 2. No dissection, aneurysm or hemodynamically significant stenosis of the carotid or vertebral arterial systems. 3. Minimal aortic atherosclerosis (ICD10-I70.0).    Assessment/Plan:  1.  Chronic daily headaches  2.  Right frontal stroke  The patient is to try Aimovig for another month, if no benefit is noted, they will stop the drug.  The patient is having some memory problems and increased fatigue and increased headache following the stroke.  She is having some problems with increased nausea, decreased appetite.  A prescription for Zofran will be given.  She will follow-up for her next scheduled visit in June 2019.  Jill Alexanders MD 04/14/2017 12:48 PM  [facility] [address] [place], [place] [address]  Phone [phone] Fax [phone]

## 2017-04-20 ENCOUNTER — Ambulatory Visit (INDEPENDENT_AMBULATORY_CARE_PROVIDER_SITE_OTHER): Payer: Medicare Other | Admitting: *Deleted

## 2017-04-20 DIAGNOSIS — I639 Cerebral infarction, unspecified: Secondary | ICD-10-CM | POA: Diagnosis not present

## 2017-04-21 DIAGNOSIS — J181 Lobar pneumonia, unspecified organism: Secondary | ICD-10-CM | POA: Diagnosis not present

## 2017-04-21 DIAGNOSIS — D4620 Refractory anemia with excess of blasts, unspecified: Secondary | ICD-10-CM | POA: Diagnosis not present

## 2017-04-21 DIAGNOSIS — D6489 Other specified anemias: Secondary | ICD-10-CM | POA: Diagnosis not present

## 2017-04-22 DIAGNOSIS — D469 Myelodysplastic syndrome, unspecified: Secondary | ICD-10-CM | POA: Diagnosis not present

## 2017-04-22 DIAGNOSIS — D461 Refractory anemia with ring sideroblasts: Secondary | ICD-10-CM | POA: Diagnosis not present

## 2017-04-22 NOTE — Progress Notes (Signed)
Carelink Summary Report / Loop Recorder 

## 2017-04-23 DIAGNOSIS — D461 Refractory anemia with ring sideroblasts: Secondary | ICD-10-CM | POA: Diagnosis not present

## 2017-05-05 ENCOUNTER — Other Ambulatory Visit: Payer: Self-pay | Admitting: Neurology

## 2017-05-10 ENCOUNTER — Telehealth: Payer: Self-pay | Admitting: Neurology

## 2017-05-10 MED ORDER — BUTORPHANOL TARTRATE 10 MG/ML NA SOLN: NASAL | 5 refills | Status: DC

## 2017-05-10 MED ORDER — BUTORPHANOL TARTRATE 10 MG/ML NA SOLN: 1.0000 | NASAL | 5 refills | Status: DC | PRN

## 2017-05-10 NOTE — Telephone Encounter (Signed)
Patient's husband calling regarding Rx's  butorphanol (STADOL) 2.5 ML nasal Spray and butorphanol (STADOL) 12.5 ML nasal Spray. He says refills were called to [facility] in [place] but 10ML was filled which is wrong.

## 2017-05-10 NOTE — Telephone Encounter (Signed)
I called the patient.  The last state all prescription was written for the wrong amount, I called the pharmacy and cancel that, I rewrote the prescriptions.

## 2017-05-10 NOTE — Addendum Note (Signed)
Addended by: Kathrynn Ducking on: 05/10/2017 05:31 PM   Modules accepted: Orders

## 2017-05-18 DIAGNOSIS — K222 Esophageal obstruction: Secondary | ICD-10-CM | POA: Diagnosis not present

## 2017-05-18 DIAGNOSIS — K580 Irritable bowel syndrome with diarrhea: Secondary | ICD-10-CM | POA: Diagnosis not present

## 2017-05-18 DIAGNOSIS — K219 Gastro-esophageal reflux disease without esophagitis: Secondary | ICD-10-CM | POA: Diagnosis not present

## 2017-05-19 ENCOUNTER — Telehealth: Payer: Self-pay | Admitting: Cardiology

## 2017-05-19 DIAGNOSIS — D519 Vitamin B12 deficiency anemia, unspecified: Secondary | ICD-10-CM | POA: Diagnosis not present

## 2017-05-19 DIAGNOSIS — I10 Essential (primary) hypertension: Secondary | ICD-10-CM | POA: Diagnosis not present

## 2017-05-19 DIAGNOSIS — Z79899 Other long term (current) drug therapy: Secondary | ICD-10-CM | POA: Diagnosis not present

## 2017-05-19 DIAGNOSIS — Z760 Encounter for issue of repeat prescription: Secondary | ICD-10-CM | POA: Diagnosis not present

## 2017-05-19 DIAGNOSIS — E782 Mixed hyperlipidemia: Secondary | ICD-10-CM | POA: Diagnosis not present

## 2017-05-19 DIAGNOSIS — E559 Vitamin D deficiency, unspecified: Secondary | ICD-10-CM | POA: Diagnosis not present

## 2017-05-19 NOTE — Telephone Encounter (Signed)
Spoke w/ pt and requested that she send a manual transmission b/c her home monitor has not updated in at least 14 days.   

## 2017-05-24 ENCOUNTER — Ambulatory Visit (INDEPENDENT_AMBULATORY_CARE_PROVIDER_SITE_OTHER): Payer: Medicare Other | Admitting: *Deleted

## 2017-05-24 DIAGNOSIS — I639 Cerebral infarction, unspecified: Secondary | ICD-10-CM | POA: Diagnosis not present

## 2017-05-24 LAB — CUP PACEART REMOTE DEVICE CHECK
Implantable Pulse Generator Implant Date: 20181221
MDC IDC SESS DTM: 20190220200201

## 2017-05-24 NOTE — Progress Notes (Signed)
Carelink Summary Report / Loop Recorder 

## 2017-05-26 DIAGNOSIS — D461 Refractory anemia with ring sideroblasts: Secondary | ICD-10-CM | POA: Diagnosis not present

## 2017-05-26 DIAGNOSIS — D469 Myelodysplastic syndrome, unspecified: Secondary | ICD-10-CM | POA: Diagnosis not present

## 2017-05-27 DIAGNOSIS — M461 Sacroiliitis, not elsewhere classified: Secondary | ICD-10-CM | POA: Diagnosis not present

## 2017-05-27 DIAGNOSIS — M545 Low back pain: Secondary | ICD-10-CM | POA: Diagnosis not present

## 2017-05-28 DIAGNOSIS — D461 Refractory anemia with ring sideroblasts: Secondary | ICD-10-CM | POA: Diagnosis not present

## 2017-06-02 DIAGNOSIS — K2960 Other gastritis without bleeding: Secondary | ICD-10-CM | POA: Diagnosis not present

## 2017-06-02 DIAGNOSIS — K219 Gastro-esophageal reflux disease without esophagitis: Secondary | ICD-10-CM | POA: Diagnosis not present

## 2017-06-02 DIAGNOSIS — K222 Esophageal obstruction: Secondary | ICD-10-CM | POA: Diagnosis not present

## 2017-06-02 DIAGNOSIS — R1310 Dysphagia, unspecified: Secondary | ICD-10-CM | POA: Diagnosis not present

## 2017-06-11 DIAGNOSIS — Z452 Encounter for adjustment and management of vascular access device: Secondary | ICD-10-CM | POA: Diagnosis not present

## 2017-06-11 DIAGNOSIS — Z79899 Other long term (current) drug therapy: Secondary | ICD-10-CM | POA: Diagnosis not present

## 2017-06-11 DIAGNOSIS — D461 Refractory anemia with ring sideroblasts: Secondary | ICD-10-CM | POA: Diagnosis not present

## 2017-06-11 DIAGNOSIS — Z0001 Encounter for general adult medical examination with abnormal findings: Secondary | ICD-10-CM | POA: Diagnosis not present

## 2017-06-11 DIAGNOSIS — Z8673 Personal history of transient ischemic attack (TIA), and cerebral infarction without residual deficits: Secondary | ICD-10-CM | POA: Diagnosis not present

## 2017-06-23 DIAGNOSIS — D461 Refractory anemia with ring sideroblasts: Secondary | ICD-10-CM | POA: Diagnosis not present

## 2017-06-23 DIAGNOSIS — R110 Nausea: Secondary | ICD-10-CM | POA: Diagnosis not present

## 2017-06-23 DIAGNOSIS — G3184 Mild cognitive impairment, so stated: Secondary | ICD-10-CM | POA: Diagnosis not present

## 2017-06-23 DIAGNOSIS — D469 Myelodysplastic syndrome, unspecified: Secondary | ICD-10-CM | POA: Diagnosis not present

## 2017-06-24 DIAGNOSIS — D461 Refractory anemia with ring sideroblasts: Secondary | ICD-10-CM | POA: Diagnosis not present

## 2017-06-28 ENCOUNTER — Ambulatory Visit (INDEPENDENT_AMBULATORY_CARE_PROVIDER_SITE_OTHER): Payer: Medicare Other | Admitting: *Deleted

## 2017-06-28 DIAGNOSIS — I639 Cerebral infarction, unspecified: Secondary | ICD-10-CM

## 2017-06-28 NOTE — Progress Notes (Signed)
Carelink Summary Report / Loop Recorder 

## 2017-07-03 LAB — CUP PACEART REMOTE DEVICE CHECK
Date Time Interrogation Session: 20190325183823
Implantable Pulse Generator Implant Date: 20181221

## 2017-07-14 DIAGNOSIS — D461 Refractory anemia with ring sideroblasts: Secondary | ICD-10-CM | POA: Diagnosis not present

## 2017-07-14 DIAGNOSIS — D61818 Other pancytopenia: Secondary | ICD-10-CM | POA: Diagnosis not present

## 2017-07-14 DIAGNOSIS — D469 Myelodysplastic syndrome, unspecified: Secondary | ICD-10-CM | POA: Diagnosis not present

## 2017-07-14 DIAGNOSIS — Z8673 Personal history of transient ischemic attack (TIA), and cerebral infarction without residual deficits: Secondary | ICD-10-CM | POA: Diagnosis not present

## 2017-07-15 DIAGNOSIS — D461 Refractory anemia with ring sideroblasts: Secondary | ICD-10-CM | POA: Diagnosis not present

## 2017-07-21 LAB — CUP PACEART REMOTE DEVICE CHECK
Date Time Interrogation Session: 20190427193741
MDC IDC PG IMPLANT DT: 20181221

## 2017-07-25 DIAGNOSIS — R220 Localized swelling, mass and lump, head: Secondary | ICD-10-CM | POA: Diagnosis not present

## 2017-07-25 DIAGNOSIS — L03213 Periorbital cellulitis: Secondary | ICD-10-CM | POA: Diagnosis not present

## 2017-07-28 DIAGNOSIS — I10 Essential (primary) hypertension: Secondary | ICD-10-CM | POA: Diagnosis not present

## 2017-07-28 DIAGNOSIS — D696 Thrombocytopenia, unspecified: Secondary | ICD-10-CM | POA: Diagnosis not present

## 2017-07-29 ENCOUNTER — Ambulatory Visit (INDEPENDENT_AMBULATORY_CARE_PROVIDER_SITE_OTHER): Payer: Medicare Other | Admitting: *Deleted

## 2017-07-29 DIAGNOSIS — I639 Cerebral infarction, unspecified: Secondary | ICD-10-CM

## 2017-07-30 DIAGNOSIS — I10 Essential (primary) hypertension: Secondary | ICD-10-CM | POA: Diagnosis not present

## 2017-07-30 DIAGNOSIS — R51 Headache: Secondary | ICD-10-CM | POA: Diagnosis not present

## 2017-07-30 NOTE — Progress Notes (Signed)
Carelink Summary Report / Loop Recorder 

## 2017-08-05 ENCOUNTER — Encounter: Payer: Self-pay | Admitting: Neurology

## 2017-08-05 ENCOUNTER — Ambulatory Visit: Payer: Medicare Other | Admitting: Neurology

## 2017-08-05 VITALS — BP 118/60 | HR 76 | Ht <= 58 in | Wt 125.0 lb

## 2017-08-05 DIAGNOSIS — I63411 Cerebral infarction due to embolism of right middle cerebral artery: Secondary | ICD-10-CM | POA: Diagnosis not present

## 2017-08-05 DIAGNOSIS — G43019 Migraine without aura, intractable, without status migrainosus: Secondary | ICD-10-CM

## 2017-08-05 NOTE — Progress Notes (Signed)
Reason for visit: Chronic headache  Kristin Mendez is an 77 y.o. female  History of present illness:  Kristin Mendez is a 77 year old right-handed white female with a history of chronic daily headaches.  The patient continues to have headaches regularly, the trial on Aimovig did not help.  The patient in the past claims that she had Botox therapy on one occasion, this resulted in neck weakness, she is not sure that the Botox helped her.  She only had one treatment.  The patient has had a right frontal stroke, she has recovered from this but still has some fatigue.  She is back to her usual activities of daily living.  She does have some troubles with nausea that is chronic in nature, her primary doctor has given her Reglan to take.  She is losing some weight because of this.  She does not feel like eating.  The patient returns for further evaluation.  Past Medical History:  Diagnosis Date  . Abnormality of gait 07/10/2015  . Ankle fracture, left   . Arthritis   . Cervical spondylosis without myelopathy 02/08/2013  . Dyslipidemia   . Headache(784.0)    Chronic daily  . Hypertension   . Myelodysplasia   . Obesity   . Occipital neuralgia    Right  . Peptic ulcer disease   . Vitamin D deficiency     Past Surgical History:  Procedure Laterality Date  . ABDOMINAL HYSTERECTOMY    . ANKLE FRACTURE SURGERY Left   . APPENDECTOMY    . BREAST LUMPECTOMY    . CATARACT EXTRACTION, BILATERAL    . DILATION AND CURETTAGE OF UTERUS    . LOOP RECORDER INSERTION N/A 02/19/2017   Procedure: LOOP RECORDER INSERTION;  Surgeon: Constance Haw, MD;  Location: [facility];  Service: Cardiovascular;  Laterality: N/A;  . LUMBAR LAMINECTOMY     Upper pending  . LUMBAR SPINE SURGERY     5 on occasions  . ROTATOR CUFF REPAIR Left   . THORACIC LAMINECTOMY  9/13  . TONSILLECTOMY    . TUBAL LIGATION Bilateral     Family History  Problem Relation Age of Onset  . Alzheimer's disease Mother     . Heart failure Mother   . Heart attack Father   . Diabetes Brother   . Bipolar disorder Brother   . Stroke Maternal Grandfather     Social history:  reports that she has never smoked. She has never used smokeless tobacco. She reports that she does not drink alcohol or use drugs.    Allergies  Allergen Reactions  . Novocain [Procaine Hcl] Anaphylaxis and Swelling  . Codeine Other (See Comments)    Reaction not recalled  . Lyrica [Pregabalin] Other (See Comments)    Feet became swollen    Medications:  Prior to Admission medications   Medication Sig Start Date End Date Taking? Authorizing Provider  alosetron (LOTRONEX) 0.5 MG tablet Take 0.5 mg by mouth 2 (two) times daily as needed (for IBS symptoms).    Yes [provider]  aspirin 81 MG EC tablet Take 1 tablet (81 mg total) by mouth daily. 02/20/17  Yes Mikhail, Maryann, DO  butorphanol (STADOL) 10 MG/ML nasal spray USE 1 DOSE EVERY 4 HOURS AS NEEDED FOR HEADACHE 05/10/17  Yes Kathrynn Ducking, MD  butorphanol (STADOL) 10 MG/ML nasal spray Place 1 spray into the nose every 4 (four) hours as needed for headache. 05/10/17  Yes Kathrynn Ducking, MD  diphenhydrAMINE (BENADRYL) 25 MG tablet Take 25 mg by mouth every 6 (six) hours as needed for allergies.   Yes [provider]  doxepin (SINEQUAN) 25 MG capsule Take 50 mg by mouth at bedtime.    Yes [provider]  feeding supplement, ENSURE ENLIVE, (ENSURE ENLIVE) LIQD Take 237 mLs by mouth 2 (two) times daily between meals. 02/19/17  Yes Mikhail, Velta Addison, DO  HYDROcodone-acetaminophen (NORCO/VICODIN) 5-325 MG per tablet Take 1 tablet by mouth every 6 (six) hours as needed for moderate pain.   Yes [provider]  NARCAN 4 MG/0.1ML LIQD nasal spray kit Place 1 spray into the nose once as needed (AS DIRECTED).  12/03/16  Yes [provider]  nitroGLYCERIN (NITROSTAT) 0.4 MG SL tablet Place 0.4 mg under the tongue every 5 (five) minutes as  needed for chest pain.   Yes [provider]  omeprazole (PRILOSEC) 40 MG capsule Take 40 mg by mouth 2 (two) times daily. 12/01/16  Yes [provider]  ondansetron (ZOFRAN) 4 MG tablet Take 1 tablet (4 mg total) by mouth every 8 (eight) hours as needed for nausea or vomiting. 04/14/17  Yes Kathrynn Ducking, MD  propranolol (INDERAL) 40 MG tablet Take 40 mg by mouth 2 (two) times daily.   Yes [provider]  rosuvastatin (CRESTOR) 10 MG tablet Take 10 mg by mouth daily.   Yes [provider]  tiZANidine (ZANAFLEX) 4 MG tablet Take 4 mg by mouth 3 (three) times daily.    Yes [provider]  triamcinolone (NASACORT ALLERGY 24HR) 55 MCG/ACT AERO nasal inhaler Place 2 sprays into the nose daily as needed (for allergic symptoms).   Yes [provider]  vitamin B-12 (CYANOCOBALAMIN) 1000 MCG tablet Take 1,000 mcg by mouth daily.   Yes [provider]    ROS:  Out of a complete 14 system review of symptoms, the patient complains only of the following symptoms, and all other reviewed systems are negative.  Snoring Back pain Anemia Headache Confusion  Blood pressure 118/60, pulse 76, height '4\' 9"'$  (1.448 m), weight 125 lb (56.7 kg), SpO2 95 %.  Physical Exam  General: The patient is alert and cooperative at the time of the examination.  Skin: No significant peripheral edema is noted.   Neurologic Exam  Mental status: The patient is alert and oriented x 3 at the time of the examination. The patient has apparent normal recent and remote memory, with an apparently normal attention span and concentration ability.   Cranial nerves: Facial symmetry is present. Speech is normal, no aphasia or dysarthria is noted. Extraocular movements are full. Visual fields are full.  Motor: The patient has good strength in all 4 extremities.  Sensory examination: Soft touch sensation is symmetric on the face, arms, and legs.  Coordination: The  patient has good finger-nose-finger and heel-to-shin bilaterally.  Gait and station: The patient has a normal gait. Tandem gait is unsteady. Romberg is negative. No drift is seen.  Reflexes: Deep tendon reflexes are symmetric.   Assessment/Plan:  1.  Chronic daily headache  2.  History of stroke, right frontal  The patient will continue her current medications for the headache which includes primarily Stadol.  If she does wish to retry a Botox regimen, she is to contact our office.  The patient will follow-up otherwise in 6 months.  Jill Alexanders MD 08/05/2017 11:58 AM  [facility] [address] [place], [place] [address]  Phone [phone] Fax  [phone]

## 2017-08-11 DIAGNOSIS — D469 Myelodysplastic syndrome, unspecified: Secondary | ICD-10-CM | POA: Diagnosis not present

## 2017-08-11 DIAGNOSIS — D461 Refractory anemia with ring sideroblasts: Secondary | ICD-10-CM | POA: Diagnosis not present

## 2017-08-12 DIAGNOSIS — D461 Refractory anemia with ring sideroblasts: Secondary | ICD-10-CM | POA: Diagnosis not present

## 2017-08-16 ENCOUNTER — Telehealth: Payer: Self-pay | Admitting: Neurology

## 2017-08-16 NOTE — Telephone Encounter (Signed)
I called and spoke with the patients husband (listed on DPR). I offered him several apt's but we ended up settling on one in August.

## 2017-08-16 NOTE — Telephone Encounter (Signed)
Pt's husband called to set up appt for botox   Please call to advise.

## 2017-08-23 LAB — CUP PACEART REMOTE DEVICE CHECK
Implantable Pulse Generator Implant Date: 20181221
MDC IDC SESS DTM: 20190530193604

## 2017-08-26 DIAGNOSIS — Z13820 Encounter for screening for osteoporosis: Secondary | ICD-10-CM | POA: Diagnosis not present

## 2017-08-26 DIAGNOSIS — M859 Disorder of bone density and structure, unspecified: Secondary | ICD-10-CM | POA: Diagnosis not present

## 2017-08-26 DIAGNOSIS — Z0000 Encounter for general adult medical examination without abnormal findings: Secondary | ICD-10-CM | POA: Diagnosis not present

## 2017-08-26 DIAGNOSIS — I2510 Atherosclerotic heart disease of native coronary artery without angina pectoris: Secondary | ICD-10-CM | POA: Diagnosis not present

## 2017-08-26 DIAGNOSIS — M8580 Other specified disorders of bone density and structure, unspecified site: Secondary | ICD-10-CM | POA: Diagnosis not present

## 2017-08-26 DIAGNOSIS — I119 Hypertensive heart disease without heart failure: Secondary | ICD-10-CM | POA: Diagnosis not present

## 2017-09-01 ENCOUNTER — Ambulatory Visit (INDEPENDENT_AMBULATORY_CARE_PROVIDER_SITE_OTHER): Payer: Medicare Other | Admitting: *Deleted

## 2017-09-01 DIAGNOSIS — I639 Cerebral infarction, unspecified: Secondary | ICD-10-CM | POA: Diagnosis not present

## 2017-09-01 NOTE — Progress Notes (Signed)
Carelink Summary Report / Loop Recorder 

## 2017-09-08 DIAGNOSIS — D461 Refractory anemia with ring sideroblasts: Secondary | ICD-10-CM | POA: Diagnosis not present

## 2017-09-08 DIAGNOSIS — D469 Myelodysplastic syndrome, unspecified: Secondary | ICD-10-CM | POA: Diagnosis not present

## 2017-09-09 DIAGNOSIS — D461 Refractory anemia with ring sideroblasts: Secondary | ICD-10-CM | POA: Diagnosis not present

## 2017-09-20 DIAGNOSIS — I119 Hypertensive heart disease without heart failure: Secondary | ICD-10-CM | POA: Diagnosis not present

## 2017-09-20 DIAGNOSIS — R0609 Other forms of dyspnea: Secondary | ICD-10-CM | POA: Diagnosis not present

## 2017-09-20 DIAGNOSIS — D4620 Refractory anemia with excess of blasts, unspecified: Secondary | ICD-10-CM | POA: Diagnosis not present

## 2017-09-20 DIAGNOSIS — R600 Localized edema: Secondary | ICD-10-CM | POA: Diagnosis not present

## 2017-09-28 DIAGNOSIS — Z1231 Encounter for screening mammogram for malignant neoplasm of breast: Secondary | ICD-10-CM | POA: Diagnosis not present

## 2017-09-29 ENCOUNTER — Encounter: Payer: Self-pay | Admitting: Cardiology

## 2017-09-29 ENCOUNTER — Ambulatory Visit: Payer: Medicare Other | Admitting: Cardiology

## 2017-09-29 VITALS — BP 128/64 | HR 68 | Wt 126.0 lb

## 2017-09-29 DIAGNOSIS — I10 Essential (primary) hypertension: Secondary | ICD-10-CM | POA: Diagnosis not present

## 2017-09-29 DIAGNOSIS — I639 Cerebral infarction, unspecified: Secondary | ICD-10-CM

## 2017-09-29 DIAGNOSIS — R011 Cardiac murmur, unspecified: Secondary | ICD-10-CM | POA: Diagnosis not present

## 2017-09-29 NOTE — Patient Instructions (Signed)
Medication Instructions:  Your physician recommends that you continue on your current medications as directed. Please refer to the Current Medication list given to you today.   Labwork: None.  Testing/Procedures: Your physician has requested that you have an echocardiogram. Echocardiography is a painless test that uses sound waves to create images of your heart. It provides your doctor with information about the size and shape of your heart and how well your heart's chambers and valves are working. This procedure takes approximately one hour. There are no restrictions for this procedure.    Follow-Up: Your physician wants you to follow-up in: 1 year. You will receive a reminder letter in the mail two months in advance. If you don't receive a letter, please call our office to schedule the follow-up appointment.   Any Other Special Instructions Will Be Listed Below (If Applicable).  Echocardiogram An echocardiogram, or echocardiography, uses sound waves (ultrasound) to produce an image of your heart. The echocardiogram is simple, painless, obtained within a short period of time, and offers valuable information to your health care provider. The images from an echocardiogram can provide information such as:  Evidence of coronary artery disease (CAD).  Heart size.  Heart muscle function.  Heart valve function.  Aneurysm detection.  Evidence of a past heart attack.  Fluid buildup around the heart.  Heart muscle thickening.  Assess heart valve function.  Tell a health care provider about:  Any allergies you have.  All medicines you are taking, including vitamins, herbs, eye drops, creams, and over-the-counter medicines.  Any problems you or family members have had with anesthetic medicines.  Any blood disorders you have.  Any surgeries you have had.  Any medical conditions you have.  Whether you are pregnant or may be pregnant. What happens before the procedure? No  special preparation is needed. Eat and drink normally. What happens during the procedure?  In order to produce an image of your heart, gel will be applied to your chest and a wand-like tool (transducer) will be moved over your chest. The gel will help transmit the sound waves from the transducer. The sound waves will harmlessly bounce off your heart to allow the heart images to be captured in real-time motion. These images will then be recorded.  You may need an IV to receive a medicine that improves the quality of the pictures. What happens after the procedure? You may return to your normal schedule including diet, activities, and medicines, unless your health care provider tells you otherwise. This information is not intended to replace advice given to you by your health care provider. Make sure you discuss any questions you have with your health care provider. Document Released: 02/14/2000 Document Revised: 10/05/2015 Document Reviewed: 10/24/2012 Elsevier Interactive Patient Education  2017 [employer].      If you need a refill on your cardiac medications before your next appointment, please call your pharmacy.

## 2017-09-29 NOTE — Progress Notes (Signed)
Cardiology Office Note:    Date:  09/29/2017   ID:  Kristin Mendez, DOB 31-Dec-1940, MRN [id number]  PCP:  Serita Grammes, MD  Cardiologist:  Jenean Lindau, MD   Referring MD: Serita Grammes, MD    ASSESSMENT:    1. Cerebrovascular accident (CVA), unspecified mechanism ([place])   2. Essential hypertension    PLAN:    In order of problems listed above:  1. Primary prevention stressed with the patient.  Importance of compliance with diet and medication stressed and she vocalized understanding.  Her blood pressure is stable. 2. She will undergo echocardiogram to assess murmur heard on auscultation.  Her blood work including hemoglobin has been followed by primary care physician. 3. I reviewed most risk of the records extensively including new bradycardia evaluation.  Questions were answered to their satisfaction. 4. She will be seen in follow-up appointment on annual basis or earlier if she has any concerns.   Medication Adjustments/Labs and Tests Ordered: Current medicines are reviewed at length with the patient today.  Concerns regarding medicines are outlined above.  No orders of the defined types were placed in this encounter.  No orders of the defined types were placed in this encounter.    Chief Complaint  Patient presents with  . Follow-up    follow up visit.      History of Present Illness:    Kristin Mendez is a 77 y.o. female.  She has past medical history of essential hypertension, myelodysplasia.  She mentions to me that she gets blood transfusions on a regular basis.  She has had a history of multiple strokes and therefore she has a loop recorder.  By far the loop recorder has been unremarkable.  She denies any chest pain orthopnea or PND.  Echocardiogram was felt to be necessary to assess her left ventricular systolic function and murmur heard on auscultation and therefore she was sent here for evaluation.  Her husband accompanies her for this visit.  This  patient has been under my care in my previous practice.  He is here now to transfer his care and be established with my current practice.  At the time of my evaluation, the patient is alert awake oriented and in no distress.  Past Medical History:  Diagnosis Date  . Abnormality of gait 07/10/2015  . Ankle fracture, left   . Arthritis   . Cervical spondylosis without myelopathy 02/08/2013  . Dyslipidemia   . Headache(784.0)    Chronic daily  . Hypertension   . Myelodysplasia   . Obesity   . Occipital neuralgia    Right  . Peptic ulcer disease   . Vitamin D deficiency     Past Surgical History:  Procedure Laterality Date  . ABDOMINAL HYSTERECTOMY    . ANKLE FRACTURE SURGERY Left   . APPENDECTOMY    . BREAST LUMPECTOMY    . CATARACT EXTRACTION, BILATERAL    . DILATION AND CURETTAGE OF UTERUS    . LOOP RECORDER INSERTION N/A 02/19/2017   Procedure: LOOP RECORDER INSERTION;  Surgeon: Constance Haw, MD;  Location: [facility];  Service: Cardiovascular;  Laterality: N/A;  . LUMBAR LAMINECTOMY     Upper pending  . LUMBAR SPINE SURGERY     5 on occasions  . ROTATOR CUFF REPAIR Left   . THORACIC LAMINECTOMY  9/13  . TONSILLECTOMY    . TUBAL LIGATION Bilateral     Current Medications: Current Meds  Medication Sig  . alosetron (  LOTRONEX) 0.5 MG tablet Take 0.5 mg by mouth 2 (two) times daily as needed (for IBS symptoms).   Marland Kitchen aspirin 81 MG EC tablet Take 1 tablet (81 mg total) by mouth daily.  . butorphanol (STADOL) 10 MG/ML nasal spray USE 1 DOSE EVERY 4 HOURS AS NEEDED FOR HEADACHE  . butorphanol (STADOL) 10 MG/ML nasal spray Place 1 spray into the nose every 4 (four) hours as needed for headache.  . diphenhydrAMINE (BENADRYL) 25 MG tablet Take 25 mg by mouth every 6 (six) hours as needed for allergies.  Marland Kitchen doxepin (SINEQUAN) 25 MG capsule Take 50 mg by mouth at bedtime.   Marland Kitchen HYDROcodone-acetaminophen (NORCO/VICODIN) 5-325 MG per tablet Take 1 tablet by mouth every 6  (six) hours as needed for moderate pain.  Marland Kitchen NARCAN 4 MG/0.1ML LIQD nasal spray kit Place 1 spray into the nose once as needed (AS DIRECTED).   . nitroGLYCERIN (NITROSTAT) 0.4 MG SL tablet Place 0.4 mg under the tongue every 5 (five) minutes as needed for chest pain.  Marland Kitchen omeprazole (PRILOSEC) 40 MG capsule Take 40 mg by mouth 2 (two) times daily.  . ondansetron (ZOFRAN) 4 MG tablet Take 1 tablet (4 mg total) by mouth every 8 (eight) hours as needed for nausea or vomiting.  . propranolol (INDERAL) 40 MG tablet Take 40 mg by mouth 2 (two) times daily.  . rosuvastatin (CRESTOR) 10 MG tablet Take 10 mg by mouth daily.  Marland Kitchen tiZANidine (ZANAFLEX) 4 MG tablet Take 4 mg by mouth 3 (three) times daily.   Marland Kitchen triamcinolone (NASACORT ALLERGY 24HR) 55 MCG/ACT AERO nasal inhaler Place 2 sprays into the nose daily as needed (for allergic symptoms).     Allergies:   Novocain [procaine hcl]; Codeine; Lyrica [pregabalin]; and Procaine   Social History   Socioeconomic History  . Marital status: Married    Spouse name: Not on file  . Number of children: 0  . Years of education: HS  . Highest education level: Not on file  Occupational History  . Occupation: Retired  Scientific laboratory technician  . Financial resource strain: Not on file  . Food insecurity:    Worry: Not on file    Inability: Not on file  . Transportation needs:    Medical: Not on file    Non-medical: Not on file  Tobacco Use  . Smoking status: Never Smoker  . Smokeless tobacco: Never Used  Substance and Sexual Activity  . Alcohol use: No  . Drug use: No  . Sexual activity: Not on file  Lifestyle  . Physical activity:    Days per week: Not on file    Minutes per session: Not on file  . Stress: Not on file  Relationships  . Social connections:    Talks on phone: Not on file    Gets together: Not on file    Attends religious service: Not on file    Active member of club or organization: Not on file    Attends meetings of clubs or organizations:  Not on file    Relationship status: Not on file  Other Topics Concern  . Not on file  Social History Narrative   Lives at home w/ her husband   Patient is right handed.   Patient drinks 1 cup caffeine daily.     Family History: The patient's family history includes Alzheimer's disease in her mother; Bipolar disorder in her brother; Diabetes in her brother; Heart attack in her father; Heart failure in her mother; Stroke  in her maternal grandfather.  ROS:   Please see the history of present illness.    All other systems reviewed and are negative.  EKGs/Labs/Other Studies Reviewed:    The following studies were reviewed today: EKG reveals sinus rhythm and nonspecific ST-T changes.   Recent Labs: 02/17/2017: ALT 11 02/18/2017: Hemoglobin 8.8; Platelets 74 02/19/2017: BUN 7; Creatinine, Ser 0.63; Potassium 3.3; Sodium 138  Recent Lipid Panel    Component Value Date/Time   CHOL 105 02/18/2017 0453   TRIG 72 02/18/2017 0453   HDL 39 (L) 02/18/2017 0453   CHOLHDL 2.7 02/18/2017 0453   VLDL 14 02/18/2017 0453   LDLCALC 52 02/18/2017 0453    Physical Exam:    VS:  BP 128/64 (BP Location: Right Arm, Patient Position: Sitting, Cuff Size: Normal)   Pulse 68   Wt 126 lb (57.2 kg)   SpO2 99%   BMI 27.27 kg/m     Wt Readings from Last 3 Encounters:  09/29/17 126 lb (57.2 kg)  08/05/17 125 lb (56.7 kg)  04/14/17 130 lb (59 kg)     GEN: Patient is in no acute distress HEENT: Normal NECK: No JVD; No carotid bruits LYMPHATICS: No lymphadenopathy CARDIAC: Hear sounds regular, 2/6 systolic murmur at the apex. RESPIRATORY:  Clear to auscultation without rales, wheezing or rhonchi  ABDOMEN: Soft, non-tender, non-distended MUSCULOSKELETAL:  No edema; No deformity  SKIN: Warm and dry NEUROLOGIC:  Alert and oriented x 3 PSYCHIATRIC:  Normal affect   Signed, Jenean Lindau, MD  09/29/2017 3:11 PM    [facility]

## 2017-10-04 ENCOUNTER — Ambulatory Visit (INDEPENDENT_AMBULATORY_CARE_PROVIDER_SITE_OTHER): Payer: Medicare Other | Admitting: *Deleted

## 2017-10-04 DIAGNOSIS — I639 Cerebral infarction, unspecified: Secondary | ICD-10-CM | POA: Diagnosis not present

## 2017-10-05 DIAGNOSIS — H04123 Dry eye syndrome of bilateral lacrimal glands: Secondary | ICD-10-CM | POA: Diagnosis not present

## 2017-10-05 DIAGNOSIS — H524 Presbyopia: Secondary | ICD-10-CM | POA: Diagnosis not present

## 2017-10-05 DIAGNOSIS — H52223 Regular astigmatism, bilateral: Secondary | ICD-10-CM | POA: Diagnosis not present

## 2017-10-05 NOTE — Progress Notes (Signed)
Carelink Summary Report / Loop Recorder 

## 2017-10-06 DIAGNOSIS — D461 Refractory anemia with ring sideroblasts: Secondary | ICD-10-CM | POA: Diagnosis not present

## 2017-10-06 DIAGNOSIS — D61818 Other pancytopenia: Secondary | ICD-10-CM | POA: Diagnosis not present

## 2017-10-06 DIAGNOSIS — D469 Myelodysplastic syndrome, unspecified: Secondary | ICD-10-CM | POA: Diagnosis not present

## 2017-10-07 ENCOUNTER — Telehealth: Payer: Self-pay | Admitting: Neurology

## 2017-10-07 DIAGNOSIS — D461 Refractory anemia with ring sideroblasts: Secondary | ICD-10-CM | POA: Diagnosis not present

## 2017-10-07 NOTE — Telephone Encounter (Signed)
I called [employer] to check benefits and coverage on the patients botox medication and injection. I spoke with Berine.   Kenedy DW

## 2017-10-08 LAB — CUP PACEART REMOTE DEVICE CHECK
Date Time Interrogation Session: 20190702193925
Implantable Pulse Generator Implant Date: 20181221

## 2017-10-17 ENCOUNTER — Other Ambulatory Visit: Payer: Self-pay | Admitting: Neurology

## 2017-10-19 ENCOUNTER — Other Ambulatory Visit: Payer: Self-pay

## 2017-10-19 ENCOUNTER — Encounter: Payer: Self-pay | Admitting: Neurology

## 2017-10-19 ENCOUNTER — Ambulatory Visit: Payer: Medicare Other | Admitting: Neurology

## 2017-10-19 VITALS — BP 138/58 | HR 93 | Ht <= 58 in | Wt 130.5 lb

## 2017-10-19 DIAGNOSIS — G43019 Migraine without aura, intractable, without status migrainosus: Secondary | ICD-10-CM

## 2017-10-19 MED ORDER — ONABOTULINUMTOXINA 100 UNITS IJ SOLR
200.0000 [IU] | Freq: Once | INTRAMUSCULAR | Status: AC
  Administered 2017-10-19: 200 [IU] via INTRAMUSCULAR

## 2017-10-19 NOTE — Progress Notes (Signed)
Please refer to Botox procedure note. 

## 2017-10-19 NOTE — Procedures (Signed)
     BOTOX PROCEDURE NOTE FOR MIGRAINE HEADACHE   HISTORY: Kristin Mendez is a 77 year old patient with a history of intractable migraine headaches, the patient has chronic daily headaches.  The patient is being treated with Botox to help reduce the frequency and severity of her headache.  This is her first treatment.   Description of procedure:  The patient was placed in a sitting position. The standard protocol was used for Botox as follows, with 5 units of Botox injected at each site:   -Procerus muscle, midline injection  -Corrugator muscle, bilateral injection  -Frontalis muscle, bilateral injection, with 2 sites each side, medial injection was performed in the upper one third of the frontalis muscle, in the region vertical from the medial inferior edge of the superior orbital rim. The lateral injection was again in the upper one third of the forehead vertically above the lateral limbus of the cornea, 1.5 cm lateral to the medial injection site.  -Temporalis muscle injection, 4 sites, bilaterally. The first injection was 3 cm above the tragus of the ear, second injection site was 1.5 cm to 3 cm up from the first injection site in line with the tragus of the ear. The third injection site was 1.5-3 cm forward between the first 2 injection sites. The fourth injection site was 1.5 cm posterior to the second injection site.  -Occipitalis muscle injection, 3 sites, bilaterally. The first injection was done one half way between the occipital protuberance and the tip of the mastoid process behind the ear. The second injection site was done lateral and superior to the first, 1 fingerbreadth from the first injection. The third injection site was 1 fingerbreadth superiorly and medially from the first injection site.  -Cervical paraspinal muscle injection, 2 sites, bilateral, the first injection site was 1 cm from the midline of the cervical spine, 3 cm inferior to the lower border of the occipital  protuberance. The second injection site was 1.5 cm superiorly and laterally to the first injection site.  -Trapezius muscle injection was performed at 3 sites, bilaterally. The first injection site was in the upper trapezius muscle halfway between the inflection point of the neck, and the acromion. The second injection site was one half way between the acromion and the first injection site. The third injection was done between the first injection site and the inflection point of the neck.   A 200 unit bottle of Botox was used, 155 units were injected, the rest of the Botox was wasted. The patient tolerated the procedure well, there were no complications of the above procedure.  Botox NDC 5449-2010-07 Lot number [id number] Expiration date March 2022

## 2017-10-19 NOTE — Patient Outreach (Signed)
[facility]) Care Management  10/19/2017  Kristin Mendez December 20, 1940 [id number]   Medication Adherence call to [name] left a message for patient to call back patient is due on Rosuvastatin 10 mg. [name] is showing past due under [place].   [facility] Management Direct Dial [phone]  Fax [phone] [name]

## 2017-10-21 DIAGNOSIS — D4620 Refractory anemia with excess of blasts, unspecified: Secondary | ICD-10-CM | POA: Diagnosis not present

## 2017-10-21 DIAGNOSIS — I10 Essential (primary) hypertension: Secondary | ICD-10-CM | POA: Diagnosis not present

## 2017-10-25 DIAGNOSIS — E782 Mixed hyperlipidemia: Secondary | ICD-10-CM | POA: Diagnosis not present

## 2017-10-27 ENCOUNTER — Ambulatory Visit: Payer: Medicare Other | Admitting: Cardiology

## 2017-10-28 DIAGNOSIS — I119 Hypertensive heart disease without heart failure: Secondary | ICD-10-CM | POA: Diagnosis not present

## 2017-10-28 DIAGNOSIS — E782 Mixed hyperlipidemia: Secondary | ICD-10-CM | POA: Diagnosis not present

## 2017-11-03 DIAGNOSIS — D696 Thrombocytopenia, unspecified: Secondary | ICD-10-CM | POA: Diagnosis not present

## 2017-11-03 DIAGNOSIS — D469 Myelodysplastic syndrome, unspecified: Secondary | ICD-10-CM | POA: Diagnosis not present

## 2017-11-03 DIAGNOSIS — D461 Refractory anemia with ring sideroblasts: Secondary | ICD-10-CM | POA: Diagnosis not present

## 2017-11-05 ENCOUNTER — Ambulatory Visit (INDEPENDENT_AMBULATORY_CARE_PROVIDER_SITE_OTHER): Payer: Medicare Other | Admitting: *Deleted

## 2017-11-05 DIAGNOSIS — I639 Cerebral infarction, unspecified: Secondary | ICD-10-CM

## 2017-11-05 DIAGNOSIS — D461 Refractory anemia with ring sideroblasts: Secondary | ICD-10-CM | POA: Diagnosis not present

## 2017-11-06 NOTE — Progress Notes (Signed)
Carelink Summary Report / Loop Recorder 

## 2017-11-09 ENCOUNTER — Other Ambulatory Visit: Payer: Medicare Other

## 2017-11-15 ENCOUNTER — Ambulatory Visit (INDEPENDENT_AMBULATORY_CARE_PROVIDER_SITE_OTHER): Payer: Medicare Other

## 2017-11-15 ENCOUNTER — Other Ambulatory Visit: Payer: Self-pay

## 2017-11-15 DIAGNOSIS — I10 Essential (primary) hypertension: Secondary | ICD-10-CM

## 2017-11-15 DIAGNOSIS — R011 Cardiac murmur, unspecified: Secondary | ICD-10-CM

## 2017-11-15 DIAGNOSIS — I639 Cerebral infarction, unspecified: Secondary | ICD-10-CM

## 2017-11-15 LAB — CUP PACEART REMOTE DEVICE CHECK
Date Time Interrogation Session: 20190804203912
Implantable Pulse Generator Implant Date: 20181221

## 2017-11-15 NOTE — Progress Notes (Signed)
Complete echocardiogram has been performed.  Jimmy Mellina Benison RDCS, RVT 

## 2017-11-18 DIAGNOSIS — M545 Low back pain: Secondary | ICD-10-CM | POA: Diagnosis not present

## 2017-11-18 DIAGNOSIS — M960 Pseudarthrosis after fusion or arthrodesis: Secondary | ICD-10-CM | POA: Diagnosis not present

## 2017-11-18 DIAGNOSIS — M461 Sacroiliitis, not elsewhere classified: Secondary | ICD-10-CM | POA: Diagnosis not present

## 2017-11-26 LAB — CUP PACEART REMOTE DEVICE CHECK
MDC IDC PG IMPLANT DT: 20181221
MDC IDC SESS DTM: 20190906220853

## 2017-12-01 DIAGNOSIS — D61818 Other pancytopenia: Secondary | ICD-10-CM

## 2017-12-01 DIAGNOSIS — D469 Myelodysplastic syndrome, unspecified: Secondary | ICD-10-CM

## 2017-12-08 ENCOUNTER — Telehealth: Payer: Self-pay | Admitting: Neurology

## 2017-12-08 NOTE — Telephone Encounter (Signed)
[facility] called requesting a call back, needing the ok to refill butorphanol (STADOL) 10 MG/ML nasal spray early due to the pt going out of town please advise

## 2017-12-08 NOTE — Telephone Encounter (Signed)
I called pharmacy, okay for an early refill, but make sure that the next refill occurs on the proper time.

## 2017-12-09 ENCOUNTER — Ambulatory Visit (INDEPENDENT_AMBULATORY_CARE_PROVIDER_SITE_OTHER): Payer: Medicare Other | Admitting: *Deleted

## 2017-12-09 DIAGNOSIS — I639 Cerebral infarction, unspecified: Secondary | ICD-10-CM | POA: Diagnosis not present

## 2017-12-09 NOTE — Progress Notes (Signed)
Carelink Summary Report / Loop Recorder 

## 2017-12-24 LAB — CUP PACEART REMOTE DEVICE CHECK
Implantable Pulse Generator Implant Date: 20181221
MDC IDC SESS DTM: 20191009223604

## 2017-12-29 DIAGNOSIS — D469 Myelodysplastic syndrome, unspecified: Secondary | ICD-10-CM

## 2018-01-03 DIAGNOSIS — R1310 Dysphagia, unspecified: Secondary | ICD-10-CM | POA: Diagnosis not present

## 2018-01-03 DIAGNOSIS — K2960 Other gastritis without bleeding: Secondary | ICD-10-CM | POA: Diagnosis not present

## 2018-01-03 DIAGNOSIS — K222 Esophageal obstruction: Secondary | ICD-10-CM | POA: Diagnosis not present

## 2018-01-03 DIAGNOSIS — K219 Gastro-esophageal reflux disease without esophagitis: Secondary | ICD-10-CM | POA: Diagnosis not present

## 2018-01-04 DIAGNOSIS — M546 Pain in thoracic spine: Secondary | ICD-10-CM | POA: Diagnosis not present

## 2018-01-04 DIAGNOSIS — M545 Low back pain: Secondary | ICD-10-CM | POA: Diagnosis not present

## 2018-01-10 ENCOUNTER — Ambulatory Visit (INDEPENDENT_AMBULATORY_CARE_PROVIDER_SITE_OTHER): Payer: Medicare Other | Admitting: *Deleted

## 2018-01-10 DIAGNOSIS — I639 Cerebral infarction, unspecified: Secondary | ICD-10-CM

## 2018-01-11 NOTE — Progress Notes (Signed)
Carelink Summary Report / Loop Recorder 

## 2018-01-18 ENCOUNTER — Encounter: Payer: Self-pay | Admitting: Neurology

## 2018-01-18 ENCOUNTER — Other Ambulatory Visit: Payer: Self-pay

## 2018-01-18 ENCOUNTER — Ambulatory Visit (INDEPENDENT_AMBULATORY_CARE_PROVIDER_SITE_OTHER): Payer: Medicare Other | Admitting: Neurology

## 2018-01-18 VITALS — BP 115/55 | HR 71 | Resp 18 | Ht <= 58 in | Wt 128.0 lb

## 2018-01-18 DIAGNOSIS — G43019 Migraine without aura, intractable, without status migrainosus: Secondary | ICD-10-CM | POA: Diagnosis not present

## 2018-01-18 MED ORDER — ONABOTULINUMTOXINA 100 UNITS IJ SOLR
200.0000 [IU] | Freq: Once | INTRAMUSCULAR | Status: AC
  Administered 2018-01-18: 200 [IU] via INTRAMUSCULAR

## 2018-01-18 NOTE — Progress Notes (Signed)
Please refer to Botox procedure note. 

## 2018-01-18 NOTE — Procedures (Signed)
     BOTOX PROCEDURE NOTE FOR MIGRAINE HEADACHE   HISTORY: Kristin Mendez is a 77 year old patient with a history of intractable headaches.  The patient has undergone a treatment with Botox, she unfortunately has not gained much benefit from the therapy.  She comes back for second treatment.  She tolerated the Botox fairly well previously.   Description of procedure:  The patient was placed in a sitting position. The standard protocol was used for Botox as follows, with 5 units of Botox injected at each site:   -Procerus muscle, midline injection  -Corrugator muscle, bilateral injection  -Frontalis muscle, bilateral injection, with 2 sites each side, medial injection was performed in the upper one third of the frontalis muscle, in the region vertical from the medial inferior edge of the superior orbital rim. The lateral injection was again in the upper one third of the forehead vertically above the lateral limbus of the cornea, 1.5 cm lateral to the medial injection site.  -Temporalis muscle injection, 4 sites, bilaterally. The first injection was 3 cm above the tragus of the ear, second injection site was 1.5 cm to 3 cm up from the first injection site in line with the tragus of the ear. The third injection site was 1.5-3 cm forward between the first 2 injection sites. The fourth injection site was 1.5 cm posterior to the second injection site.  -Occipitalis muscle injection, 3 sites, bilaterally. The first injection was done one half way between the occipital protuberance and the tip of the mastoid process behind the ear. The second injection site was done lateral and superior to the first, 1 fingerbreadth from the first injection. The third injection site was 1 fingerbreadth superiorly and medially from the first injection site.  -Cervical paraspinal muscle injection, 2 sites, bilateral, the first injection site was 1 cm from the midline of the cervical spine, 3 cm inferior to the lower  border of the occipital protuberance. The second injection site was 1.5 cm superiorly and laterally to the first injection site.  -Trapezius muscle injection was performed at 3 sites, bilaterally. The first injection site was in the upper trapezius muscle halfway between the inflection point of the neck, and the acromion. The second injection site was one half way between the acromion and the first injection site. The third injection was done between the first injection site and the inflection point of the neck.   A 200 unit bottle of Botox was used, 155 units were injected, the rest of the Botox was wasted. The patient tolerated the procedure well, there were no complications of the above procedure.  Botox NDC 2010-0712-19 Lot number [id number] Expiration date May 2022

## 2018-01-20 DIAGNOSIS — M4834 Traumatic spondylopathy, thoracic region: Secondary | ICD-10-CM | POA: Diagnosis not present

## 2018-01-24 DIAGNOSIS — D469 Myelodysplastic syndrome, unspecified: Secondary | ICD-10-CM

## 2018-01-24 DIAGNOSIS — D461 Refractory anemia with ring sideroblasts: Secondary | ICD-10-CM | POA: Diagnosis not present

## 2018-01-24 DIAGNOSIS — E876 Hypokalemia: Secondary | ICD-10-CM

## 2018-01-25 DIAGNOSIS — D461 Refractory anemia with ring sideroblasts: Secondary | ICD-10-CM | POA: Diagnosis not present

## 2018-01-29 DIAGNOSIS — E782 Mixed hyperlipidemia: Secondary | ICD-10-CM | POA: Diagnosis not present

## 2018-01-29 DIAGNOSIS — I119 Hypertensive heart disease without heart failure: Secondary | ICD-10-CM | POA: Diagnosis not present

## 2018-01-31 DIAGNOSIS — K219 Gastro-esophageal reflux disease without esophagitis: Secondary | ICD-10-CM | POA: Diagnosis not present

## 2018-01-31 DIAGNOSIS — I10 Essential (primary) hypertension: Secondary | ICD-10-CM | POA: Diagnosis not present

## 2018-01-31 DIAGNOSIS — R600 Localized edema: Secondary | ICD-10-CM | POA: Diagnosis not present

## 2018-01-31 DIAGNOSIS — D4620 Refractory anemia with excess of blasts, unspecified: Secondary | ICD-10-CM | POA: Diagnosis not present

## 2018-01-31 DIAGNOSIS — E559 Vitamin D deficiency, unspecified: Secondary | ICD-10-CM | POA: Diagnosis not present

## 2018-02-07 DIAGNOSIS — R7309 Other abnormal glucose: Secondary | ICD-10-CM | POA: Diagnosis not present

## 2018-02-07 DIAGNOSIS — E782 Mixed hyperlipidemia: Secondary | ICD-10-CM | POA: Diagnosis not present

## 2018-02-07 DIAGNOSIS — E559 Vitamin D deficiency, unspecified: Secondary | ICD-10-CM | POA: Diagnosis not present

## 2018-02-07 DIAGNOSIS — Z79899 Other long term (current) drug therapy: Secondary | ICD-10-CM | POA: Diagnosis not present

## 2018-02-08 ENCOUNTER — Telehealth: Payer: Self-pay | Admitting: Cardiology

## 2018-02-08 NOTE — Telephone Encounter (Signed)
Spoke w/ pt and requested that she send a manual transmission b/c her home monitor has not updated in at least 14 days.   

## 2018-02-09 ENCOUNTER — Ambulatory Visit: Payer: Medicare Other | Admitting: Neurology

## 2018-02-09 ENCOUNTER — Encounter: Payer: Self-pay | Admitting: Neurology

## 2018-02-09 VITALS — BP 103/59 | HR 67 | Ht <= 58 in | Wt 125.0 lb

## 2018-02-09 DIAGNOSIS — G43019 Migraine without aura, intractable, without status migrainosus: Secondary | ICD-10-CM | POA: Diagnosis not present

## 2018-02-09 DIAGNOSIS — I679 Cerebrovascular disease, unspecified: Secondary | ICD-10-CM | POA: Diagnosis not present

## 2018-02-09 NOTE — Progress Notes (Signed)
Reason for visit: Intractable headache  Kristin Mendez is an 77 y.o. female  History of present illness:  Kristin Mendez is a 77 year old right-handed white female with a history of intractable migraine headache.  The patient has gotten 2 injections of Botox, unfortunately neither treatment has offered any benefit whatsoever for her headache.  The patient continues to use Stadol on a daily basis to alleviate the headache pain, she has episodes of severe headache associated with nausea and the headaches are frequently incapacitating.  The patient has recovered well from her stroke event, she has no residual numbness or weakness.  She returns to this office for an evaluation.  Past Medical History:  Diagnosis Date  . Abnormality of gait 07/10/2015  . Ankle fracture, left   . Arthritis   . Cervical spondylosis without myelopathy 02/08/2013  . Dyslipidemia   . Headache(784.0)    Chronic daily  . Hypertension   . Myelodysplasia   . Obesity   . Occipital neuralgia    Right  . Peptic ulcer disease   . Vitamin D deficiency     Past Surgical History:  Procedure Laterality Date  . ABDOMINAL HYSTERECTOMY    . ANKLE FRACTURE SURGERY Left   . APPENDECTOMY    . BREAST LUMPECTOMY    . CATARACT EXTRACTION, BILATERAL    . DILATION AND CURETTAGE OF UTERUS    . LOOP RECORDER INSERTION N/A 02/19/2017   Procedure: LOOP RECORDER INSERTION;  Surgeon: Constance Haw, MD;  Location: [facility];  Service: Cardiovascular;  Laterality: N/A;  . LUMBAR LAMINECTOMY     Upper pending  . LUMBAR SPINE SURGERY     5 on occasions  . ROTATOR CUFF REPAIR Left   . THORACIC LAMINECTOMY  9/13  . TONSILLECTOMY    . TUBAL LIGATION Bilateral     Family History  Problem Relation Age of Onset  . Alzheimer's disease Mother   . Heart failure Mother   . Heart attack Father   . Diabetes Brother   . Bipolar disorder Brother   . Stroke Maternal Grandfather     Social history:  reports that she has  never smoked. She has never used smokeless tobacco. She reports that she does not drink alcohol or use drugs.    Allergies  Allergen Reactions  . Novocain [Procaine Hcl] Anaphylaxis and Swelling  . Codeine Other (See Comments)    Reaction not recalled  . Lyrica [Pregabalin] Other (See Comments)    Feet became swollen  . Procaine     Medications:  Prior to Admission medications   Medication Sig Start Date End Date Taking? Authorizing Provider  alosetron (LOTRONEX) 0.5 MG tablet Take 0.5 mg by mouth 2 (two) times daily as needed (for IBS symptoms).    Yes [provider]  amLODipine (NORVASC) 2.5 MG tablet Take 2.5 mg by mouth daily.   Yes [provider]  amoxicillin (AMOXIL) 500 MG tablet Take 500 mg by mouth. Takes prior to dentist appointments   Yes [provider]  aspirin 81 MG EC tablet Take 1 tablet (81 mg total) by mouth daily. 02/20/17  Yes Mikhail, Maryann, DO  butorphanol (STADOL) 10 MG/ML nasal spray USE 1 DOSE EVERY 4 HOURS AS NEEDED FOR HEADACHE 10/18/17  Yes Kathrynn Ducking, MD  diphenhydrAMINE (BENADRYL) 25 MG tablet Take 25 mg by mouth every 6 (six) hours as needed for allergies.   Yes [provider]  doxepin (SINEQUAN) 25 MG capsule Take 50  mg by mouth at bedtime.    Yes [provider]  HYDROcodone-acetaminophen (NORCO/VICODIN) 5-325 MG per tablet Take 1 tablet by mouth every 6 (six) hours as needed for moderate pain.   Yes [provider]  NARCAN 4 MG/0.1ML LIQD nasal spray kit Place 1 spray into the nose once as needed (AS DIRECTED).  12/03/16  Yes [provider]  nitroGLYCERIN (NITROSTAT) 0.4 MG SL tablet Place 0.4 mg under the tongue every 5 (five) minutes as needed for chest pain.   Yes [provider]  ondansetron (ZOFRAN) 4 MG tablet Take 1 tablet (4 mg total) by mouth every 8 (eight) hours as needed for nausea or vomiting. 04/14/17  Yes Kathrynn Ducking, MD  pantoprazole (PROTONIX) 40 MG  tablet Take 40 mg by mouth 2 (two) times daily. 01/31/18  Yes [provider]  potassium chloride (K-DUR) 10 MEQ tablet Take 10 mEq by mouth daily.   Yes [provider]  propranolol (INDERAL) 40 MG tablet Take 40 mg by mouth 2 (two) times daily.   Yes [provider]  rosuvastatin (CRESTOR) 10 MG tablet Take 10 mg by mouth daily.   Yes [provider]  tiZANidine (ZANAFLEX) 4 MG tablet Take 4 mg by mouth 3 (three) times daily.    Yes [provider]  triamcinolone (NASACORT ALLERGY 24HR) 55 MCG/ACT AERO nasal inhaler Place 2 sprays into the nose daily as needed (for allergic symptoms).   Yes [provider]  vitamin B-12 (CYANOCOBALAMIN) 1000 MCG tablet Take 1,000 mcg by mouth daily.   Yes [provider]    ROS:  Out of a complete 14 system review of symptoms, the patient complains only of the following symptoms, and all other reviewed systems are negative.  Headache  Blood pressure (!) 103/59, pulse 67, height 4' 9"  (1.448 m), weight 125 lb (56.7 kg).  Physical Exam  General: The patient is alert and cooperative at the time of the examination.  Skin: No significant peripheral edema is noted.   Neurologic Exam  Mental status: The patient is alert and oriented x 3 at the time of the examination. The patient has apparent normal recent and remote memory, with an apparently normal attention span and concentration ability.   Cranial nerves: Facial symmetry is present. Speech is normal, no aphasia or dysarthria is noted. Extraocular movements are full. Visual fields are full.  Motor: The patient has good strength in all 4 extremities.  Sensory examination: Soft touch sensation is symmetric on the face, arms, and legs.  Coordination: The patient has good finger-nose-finger and heel-to-shin bilaterally.  Gait and station: The patient has a normal gait. Tandem gait is unsteady. Romberg is negative. No drift is  seen.  Reflexes: Deep tendon reflexes are symmetric.   Assessment/Plan:  1.  Intractable migraine headache  2.  Cerebrovascular disease  The patient will continue Botox for 1 more treatment, if this offers no benefit, I would recommend cessation of therapy.  The patient will follow-up to this office in 6 months.  Jill Alexanders MD 02/09/2018 10:40 AM  [facility] [address] [place], [place] [address]  Phone [phone] Fax [phone]

## 2018-02-14 ENCOUNTER — Ambulatory Visit (INDEPENDENT_AMBULATORY_CARE_PROVIDER_SITE_OTHER): Payer: Medicare Other

## 2018-02-14 DIAGNOSIS — D469 Myelodysplastic syndrome, unspecified: Secondary | ICD-10-CM | POA: Diagnosis not present

## 2018-02-14 DIAGNOSIS — D461 Refractory anemia with ring sideroblasts: Secondary | ICD-10-CM | POA: Diagnosis not present

## 2018-02-14 DIAGNOSIS — I10 Essential (primary) hypertension: Secondary | ICD-10-CM

## 2018-02-15 DIAGNOSIS — D461 Refractory anemia with ring sideroblasts: Secondary | ICD-10-CM | POA: Diagnosis not present

## 2018-02-15 NOTE — Progress Notes (Signed)
Carelink Summary Report / Loop Recorder 

## 2018-02-16 DIAGNOSIS — M779 Enthesopathy, unspecified: Secondary | ICD-10-CM | POA: Diagnosis not present

## 2018-02-16 DIAGNOSIS — M542 Cervicalgia: Secondary | ICD-10-CM | POA: Diagnosis not present

## 2018-02-26 LAB — CUP PACEART REMOTE DEVICE CHECK
Date Time Interrogation Session: 20191111231101
MDC IDC PG IMPLANT DT: 20181221

## 2018-03-01 DIAGNOSIS — M109 Gout, unspecified: Secondary | ICD-10-CM | POA: Diagnosis not present

## 2018-03-01 DIAGNOSIS — E785 Hyperlipidemia, unspecified: Secondary | ICD-10-CM | POA: Diagnosis not present

## 2018-03-07 DIAGNOSIS — R079 Chest pain, unspecified: Secondary | ICD-10-CM | POA: Diagnosis not present

## 2018-03-07 DIAGNOSIS — D4620 Refractory anemia with excess of blasts, unspecified: Secondary | ICD-10-CM | POA: Diagnosis not present

## 2018-03-09 DIAGNOSIS — D461 Refractory anemia with ring sideroblasts: Secondary | ICD-10-CM | POA: Diagnosis not present

## 2018-03-16 DIAGNOSIS — D461 Refractory anemia with ring sideroblasts: Secondary | ICD-10-CM | POA: Diagnosis not present

## 2018-03-17 ENCOUNTER — Ambulatory Visit (INDEPENDENT_AMBULATORY_CARE_PROVIDER_SITE_OTHER): Payer: Medicare Other

## 2018-03-17 DIAGNOSIS — I639 Cerebral infarction, unspecified: Secondary | ICD-10-CM | POA: Diagnosis not present

## 2018-03-17 DIAGNOSIS — D461 Refractory anemia with ring sideroblasts: Secondary | ICD-10-CM | POA: Diagnosis not present

## 2018-03-18 NOTE — Progress Notes (Signed)
Carelink Summary Report / Loop Recorder 

## 2018-03-19 LAB — CUP PACEART REMOTE DEVICE CHECK
Implantable Pulse Generator Implant Date: 20181221
MDC IDC SESS DTM: 20200116233930

## 2018-03-20 LAB — CUP PACEART REMOTE DEVICE CHECK
Date Time Interrogation Session: 20191214230639
MDC IDC PG IMPLANT DT: 20181221

## 2018-03-23 DIAGNOSIS — K5150 Left sided colitis without complications: Secondary | ICD-10-CM | POA: Diagnosis not present

## 2018-03-23 DIAGNOSIS — R1032 Left lower quadrant pain: Secondary | ICD-10-CM | POA: Diagnosis not present

## 2018-03-23 DIAGNOSIS — R1031 Right lower quadrant pain: Secondary | ICD-10-CM | POA: Diagnosis not present

## 2018-03-23 DIAGNOSIS — K529 Noninfective gastroenteritis and colitis, unspecified: Secondary | ICD-10-CM | POA: Diagnosis not present

## 2018-03-24 ENCOUNTER — Telehealth: Payer: Self-pay | Admitting: Neurology

## 2018-03-30 MED ORDER — BUTORPHANOL TARTRATE 10 MG/ML NA SOLN: 1.0000 | Freq: Four times a day (QID) | NASAL | 0 refills | Status: DC | PRN

## 2018-03-30 NOTE — Telephone Encounter (Signed)
The patient gets to Stadol prescriptions a month, 1 is for 3 cc, and one is for 15 cc, the insurance pays for the 15 cc prescription, the patient pays out-of-pocket for the 3 cc.  The patient apparently gets an occasional hydrocodone prescription from other physicians as well.

## 2018-03-30 NOTE — Telephone Encounter (Signed)
Pt husband(on DPR) has called because normally when refills are requested on the butorphanol (STADOL) 10 MG/ML nasal spray they get 2 1 they pay for the other paid for by insurance.  On this last refill they only received one. Please call

## 2018-03-30 NOTE — Addendum Note (Signed)
Addended by: Kathrynn Ducking on: 03/30/2018 04:26 PM   Modules accepted: Orders

## 2018-03-31 DIAGNOSIS — M109 Gout, unspecified: Secondary | ICD-10-CM | POA: Diagnosis not present

## 2018-03-31 DIAGNOSIS — I10 Essential (primary) hypertension: Secondary | ICD-10-CM | POA: Diagnosis not present

## 2018-03-31 DIAGNOSIS — R1032 Left lower quadrant pain: Secondary | ICD-10-CM | POA: Diagnosis not present

## 2018-03-31 DIAGNOSIS — R197 Diarrhea, unspecified: Secondary | ICD-10-CM | POA: Diagnosis not present

## 2018-03-31 DIAGNOSIS — E785 Hyperlipidemia, unspecified: Secondary | ICD-10-CM | POA: Diagnosis not present

## 2018-04-06 DIAGNOSIS — I959 Hypotension, unspecified: Secondary | ICD-10-CM | POA: Diagnosis not present

## 2018-04-06 DIAGNOSIS — D461 Refractory anemia with ring sideroblasts: Secondary | ICD-10-CM | POA: Diagnosis not present

## 2018-04-06 DIAGNOSIS — E860 Dehydration: Secondary | ICD-10-CM | POA: Diagnosis not present

## 2018-04-06 DIAGNOSIS — D469 Myelodysplastic syndrome, unspecified: Secondary | ICD-10-CM | POA: Diagnosis not present

## 2018-04-06 DIAGNOSIS — F5089 Other specified eating disorder: Secondary | ICD-10-CM | POA: Diagnosis not present

## 2018-04-07 DIAGNOSIS — R531 Weakness: Secondary | ICD-10-CM | POA: Diagnosis not present

## 2018-04-07 DIAGNOSIS — T8092XA Unspecified transfusion reaction, initial encounter: Secondary | ICD-10-CM | POA: Diagnosis not present

## 2018-04-07 DIAGNOSIS — T458X5A Adverse effect of other primarily systemic and hematological agents, initial encounter: Secondary | ICD-10-CM | POA: Diagnosis not present

## 2018-04-07 DIAGNOSIS — T8061XA Other serum reaction due to administration of blood and blood products, initial encounter: Secondary | ICD-10-CM | POA: Diagnosis not present

## 2018-04-07 DIAGNOSIS — R079 Chest pain, unspecified: Secondary | ICD-10-CM | POA: Diagnosis not present

## 2018-04-13 DIAGNOSIS — D469 Myelodysplastic syndrome, unspecified: Secondary | ICD-10-CM

## 2018-04-13 DIAGNOSIS — N390 Urinary tract infection, site not specified: Secondary | ICD-10-CM | POA: Diagnosis not present

## 2018-04-13 DIAGNOSIS — R112 Nausea with vomiting, unspecified: Secondary | ICD-10-CM | POA: Diagnosis not present

## 2018-04-13 DIAGNOSIS — B9620 Unspecified Escherichia coli [E. coli] as the cause of diseases classified elsewhere: Secondary | ICD-10-CM | POA: Diagnosis not present

## 2018-04-13 DIAGNOSIS — E860 Dehydration: Secondary | ICD-10-CM | POA: Diagnosis not present

## 2018-04-13 DIAGNOSIS — E876 Hypokalemia: Secondary | ICD-10-CM | POA: Diagnosis not present

## 2018-04-13 DIAGNOSIS — D461 Refractory anemia with ring sideroblasts: Secondary | ICD-10-CM | POA: Diagnosis not present

## 2018-04-14 DIAGNOSIS — D461 Refractory anemia with ring sideroblasts: Secondary | ICD-10-CM | POA: Diagnosis not present

## 2018-04-15 DIAGNOSIS — D461 Refractory anemia with ring sideroblasts: Secondary | ICD-10-CM | POA: Diagnosis not present

## 2018-04-18 DIAGNOSIS — D461 Refractory anemia with ring sideroblasts: Secondary | ICD-10-CM | POA: Diagnosis not present

## 2018-04-19 ENCOUNTER — Ambulatory Visit (INDEPENDENT_AMBULATORY_CARE_PROVIDER_SITE_OTHER): Payer: Medicare Other

## 2018-04-19 DIAGNOSIS — D461 Refractory anemia with ring sideroblasts: Secondary | ICD-10-CM | POA: Diagnosis not present

## 2018-04-19 DIAGNOSIS — I639 Cerebral infarction, unspecified: Secondary | ICD-10-CM | POA: Diagnosis not present

## 2018-04-20 ENCOUNTER — Ambulatory Visit: Payer: Medicare Other | Admitting: Neurology

## 2018-04-20 ENCOUNTER — Other Ambulatory Visit: Payer: Self-pay | Admitting: Neurology

## 2018-04-20 DIAGNOSIS — R413 Other amnesia: Secondary | ICD-10-CM | POA: Diagnosis not present

## 2018-04-20 DIAGNOSIS — I639 Cerebral infarction, unspecified: Secondary | ICD-10-CM | POA: Diagnosis not present

## 2018-04-20 DIAGNOSIS — R51 Headache: Secondary | ICD-10-CM | POA: Diagnosis not present

## 2018-04-20 DIAGNOSIS — R4781 Slurred speech: Secondary | ICD-10-CM | POA: Diagnosis not present

## 2018-04-20 DIAGNOSIS — D461 Refractory anemia with ring sideroblasts: Secondary | ICD-10-CM | POA: Diagnosis not present

## 2018-04-20 LAB — CUP PACEART REMOTE DEVICE CHECK
Date Time Interrogation Session: 20200218233912
MDC IDC PG IMPLANT DT: 20181221

## 2018-04-21 DIAGNOSIS — E876 Hypokalemia: Secondary | ICD-10-CM | POA: Diagnosis not present

## 2018-04-21 DIAGNOSIS — D461 Refractory anemia with ring sideroblasts: Secondary | ICD-10-CM | POA: Diagnosis not present

## 2018-04-21 DIAGNOSIS — N3090 Cystitis, unspecified without hematuria: Secondary | ICD-10-CM | POA: Diagnosis not present

## 2018-04-21 DIAGNOSIS — D469 Myelodysplastic syndrome, unspecified: Secondary | ICD-10-CM | POA: Diagnosis not present

## 2018-04-21 DIAGNOSIS — E860 Dehydration: Secondary | ICD-10-CM | POA: Diagnosis not present

## 2018-04-21 DIAGNOSIS — B9620 Unspecified Escherichia coli [E. coli] as the cause of diseases classified elsewhere: Secondary | ICD-10-CM | POA: Diagnosis not present

## 2018-04-21 DIAGNOSIS — D61818 Other pancytopenia: Secondary | ICD-10-CM | POA: Diagnosis not present

## 2018-04-21 DIAGNOSIS — R112 Nausea with vomiting, unspecified: Secondary | ICD-10-CM | POA: Diagnosis not present

## 2018-04-22 DIAGNOSIS — R300 Dysuria: Secondary | ICD-10-CM | POA: Diagnosis not present

## 2018-04-22 DIAGNOSIS — D461 Refractory anemia with ring sideroblasts: Secondary | ICD-10-CM | POA: Diagnosis not present

## 2018-04-22 DIAGNOSIS — D469 Myelodysplastic syndrome, unspecified: Secondary | ICD-10-CM | POA: Diagnosis not present

## 2018-04-22 DIAGNOSIS — Z0001 Encounter for general adult medical examination with abnormal findings: Secondary | ICD-10-CM | POA: Diagnosis not present

## 2018-04-27 DIAGNOSIS — Z8709 Personal history of other diseases of the respiratory system: Secondary | ICD-10-CM | POA: Diagnosis not present

## 2018-04-27 DIAGNOSIS — G43909 Migraine, unspecified, not intractable, without status migrainosus: Secondary | ICD-10-CM | POA: Diagnosis not present

## 2018-04-27 DIAGNOSIS — N390 Urinary tract infection, site not specified: Secondary | ICD-10-CM | POA: Diagnosis not present

## 2018-04-27 DIAGNOSIS — E860 Dehydration: Secondary | ICD-10-CM | POA: Diagnosis not present

## 2018-04-27 DIAGNOSIS — E785 Hyperlipidemia, unspecified: Secondary | ICD-10-CM | POA: Diagnosis not present

## 2018-04-27 DIAGNOSIS — Z7982 Long term (current) use of aspirin: Secondary | ICD-10-CM | POA: Diagnosis not present

## 2018-04-27 DIAGNOSIS — A419 Sepsis, unspecified organism: Secondary | ICD-10-CM | POA: Diagnosis not present

## 2018-04-27 DIAGNOSIS — I10 Essential (primary) hypertension: Secondary | ICD-10-CM | POA: Diagnosis not present

## 2018-04-27 DIAGNOSIS — A09 Infectious gastroenteritis and colitis, unspecified: Secondary | ICD-10-CM | POA: Diagnosis not present

## 2018-04-27 DIAGNOSIS — E43 Unspecified severe protein-calorie malnutrition: Secondary | ICD-10-CM | POA: Diagnosis not present

## 2018-04-27 DIAGNOSIS — N200 Calculus of kidney: Secondary | ICD-10-CM | POA: Diagnosis not present

## 2018-04-27 DIAGNOSIS — R29701 NIHSS score 1: Secondary | ICD-10-CM | POA: Diagnosis not present

## 2018-04-27 DIAGNOSIS — K582 Mixed irritable bowel syndrome: Secondary | ICD-10-CM | POA: Diagnosis not present

## 2018-04-27 DIAGNOSIS — G9340 Encephalopathy, unspecified: Secondary | ICD-10-CM | POA: Diagnosis not present

## 2018-04-27 DIAGNOSIS — R479 Unspecified speech disturbances: Secondary | ICD-10-CM | POA: Diagnosis not present

## 2018-04-27 DIAGNOSIS — M549 Dorsalgia, unspecified: Secondary | ICD-10-CM | POA: Diagnosis not present

## 2018-04-27 DIAGNOSIS — Z95818 Presence of other cardiac implants and grafts: Secondary | ICD-10-CM | POA: Diagnosis not present

## 2018-04-27 DIAGNOSIS — D469 Myelodysplastic syndrome, unspecified: Secondary | ICD-10-CM

## 2018-04-27 DIAGNOSIS — Z515 Encounter for palliative care: Secondary | ICD-10-CM | POA: Diagnosis not present

## 2018-04-27 DIAGNOSIS — I9589 Other hypotension: Secondary | ICD-10-CM | POA: Diagnosis not present

## 2018-04-27 DIAGNOSIS — Z8673 Personal history of transient ischemic attack (TIA), and cerebral infarction without residual deficits: Secondary | ICD-10-CM | POA: Diagnosis not present

## 2018-04-27 DIAGNOSIS — E876 Hypokalemia: Secondary | ICD-10-CM | POA: Diagnosis not present

## 2018-04-27 DIAGNOSIS — G8929 Other chronic pain: Secondary | ICD-10-CM | POA: Diagnosis not present

## 2018-04-27 DIAGNOSIS — D61818 Other pancytopenia: Secondary | ICD-10-CM | POA: Diagnosis not present

## 2018-04-27 DIAGNOSIS — I639 Cerebral infarction, unspecified: Secondary | ICD-10-CM | POA: Diagnosis not present

## 2018-04-27 DIAGNOSIS — R4781 Slurred speech: Secondary | ICD-10-CM | POA: Diagnosis not present

## 2018-04-27 DIAGNOSIS — Z66 Do not resuscitate: Secondary | ICD-10-CM | POA: Diagnosis not present

## 2018-04-27 DIAGNOSIS — K5730 Diverticulosis of large intestine without perforation or abscess without bleeding: Secondary | ICD-10-CM | POA: Diagnosis not present

## 2018-04-27 DIAGNOSIS — Z79899 Other long term (current) drug therapy: Secondary | ICD-10-CM | POA: Diagnosis not present

## 2018-04-27 DIAGNOSIS — R29818 Other symptoms and signs involving the nervous system: Secondary | ICD-10-CM | POA: Diagnosis not present

## 2018-04-27 DIAGNOSIS — R29810 Facial weakness: Secondary | ICD-10-CM | POA: Diagnosis not present

## 2018-04-27 NOTE — Progress Notes (Signed)
Carelink Summary Report / Loop Recorder 

## 2018-04-28 DIAGNOSIS — D469 Myelodysplastic syndrome, unspecified: Secondary | ICD-10-CM

## 2018-04-29 DIAGNOSIS — D469 Myelodysplastic syndrome, unspecified: Secondary | ICD-10-CM

## 2018-04-30 DIAGNOSIS — D469 Myelodysplastic syndrome, unspecified: Secondary | ICD-10-CM

## 2018-05-01 DIAGNOSIS — D469 Myelodysplastic syndrome, unspecified: Secondary | ICD-10-CM

## 2018-05-02 DIAGNOSIS — I9589 Other hypotension: Secondary | ICD-10-CM | POA: Diagnosis not present

## 2018-05-02 DIAGNOSIS — G9340 Encephalopathy, unspecified: Secondary | ICD-10-CM | POA: Diagnosis not present

## 2018-05-04 DIAGNOSIS — Z7689 Persons encountering health services in other specified circumstances: Secondary | ICD-10-CM | POA: Diagnosis not present

## 2018-05-04 DIAGNOSIS — K529 Noninfective gastroenteritis and colitis, unspecified: Secondary | ICD-10-CM | POA: Diagnosis not present

## 2018-05-04 DIAGNOSIS — D4620 Refractory anemia with excess of blasts, unspecified: Secondary | ICD-10-CM | POA: Diagnosis not present

## 2018-05-05 DIAGNOSIS — D469 Myelodysplastic syndrome, unspecified: Secondary | ICD-10-CM | POA: Diagnosis not present

## 2018-05-05 DIAGNOSIS — D461 Refractory anemia with ring sideroblasts: Secondary | ICD-10-CM | POA: Diagnosis not present

## 2018-05-05 DIAGNOSIS — E860 Dehydration: Secondary | ICD-10-CM | POA: Diagnosis not present

## 2018-05-05 DIAGNOSIS — E876 Hypokalemia: Secondary | ICD-10-CM | POA: Diagnosis not present

## 2018-05-06 DIAGNOSIS — I10 Essential (primary) hypertension: Secondary | ICD-10-CM | POA: Diagnosis not present

## 2018-05-06 DIAGNOSIS — D649 Anemia, unspecified: Secondary | ICD-10-CM | POA: Diagnosis not present

## 2018-05-06 DIAGNOSIS — Z87440 Personal history of urinary (tract) infections: Secondary | ICD-10-CM | POA: Diagnosis not present

## 2018-05-06 DIAGNOSIS — Z7982 Long term (current) use of aspirin: Secondary | ICD-10-CM | POA: Diagnosis not present

## 2018-05-06 DIAGNOSIS — E43 Unspecified severe protein-calorie malnutrition: Secondary | ICD-10-CM | POA: Diagnosis not present

## 2018-05-06 DIAGNOSIS — D469 Myelodysplastic syndrome, unspecified: Secondary | ICD-10-CM | POA: Diagnosis not present

## 2018-05-06 DIAGNOSIS — Z8673 Personal history of transient ischemic attack (TIA), and cerebral infarction without residual deficits: Secondary | ICD-10-CM | POA: Diagnosis not present

## 2018-05-06 DIAGNOSIS — Z7952 Long term (current) use of systemic steroids: Secondary | ICD-10-CM | POA: Diagnosis not present

## 2018-05-06 DIAGNOSIS — Z9049 Acquired absence of other specified parts of digestive tract: Secondary | ICD-10-CM | POA: Diagnosis not present

## 2018-05-06 DIAGNOSIS — D61818 Other pancytopenia: Secondary | ICD-10-CM | POA: Diagnosis not present

## 2018-05-06 DIAGNOSIS — E860 Dehydration: Secondary | ICD-10-CM | POA: Diagnosis not present

## 2018-05-06 DIAGNOSIS — E876 Hypokalemia: Secondary | ICD-10-CM | POA: Diagnosis not present

## 2018-05-06 DIAGNOSIS — K582 Mixed irritable bowel syndrome: Secondary | ICD-10-CM | POA: Diagnosis not present

## 2018-05-06 DIAGNOSIS — M069 Rheumatoid arthritis, unspecified: Secondary | ICD-10-CM | POA: Diagnosis not present

## 2018-05-06 DIAGNOSIS — E785 Hyperlipidemia, unspecified: Secondary | ICD-10-CM | POA: Diagnosis not present

## 2018-05-06 DIAGNOSIS — G43909 Migraine, unspecified, not intractable, without status migrainosus: Secondary | ICD-10-CM | POA: Diagnosis not present

## 2018-05-07 DIAGNOSIS — Z87440 Personal history of urinary (tract) infections: Secondary | ICD-10-CM | POA: Diagnosis not present

## 2018-05-07 DIAGNOSIS — D649 Anemia, unspecified: Secondary | ICD-10-CM | POA: Diagnosis not present

## 2018-05-07 DIAGNOSIS — Z8673 Personal history of transient ischemic attack (TIA), and cerebral infarction without residual deficits: Secondary | ICD-10-CM | POA: Diagnosis not present

## 2018-05-07 DIAGNOSIS — E785 Hyperlipidemia, unspecified: Secondary | ICD-10-CM | POA: Diagnosis not present

## 2018-05-07 DIAGNOSIS — Z7982 Long term (current) use of aspirin: Secondary | ICD-10-CM | POA: Diagnosis not present

## 2018-05-07 DIAGNOSIS — Z9049 Acquired absence of other specified parts of digestive tract: Secondary | ICD-10-CM | POA: Diagnosis not present

## 2018-05-07 DIAGNOSIS — E860 Dehydration: Secondary | ICD-10-CM | POA: Diagnosis not present

## 2018-05-07 DIAGNOSIS — G43909 Migraine, unspecified, not intractable, without status migrainosus: Secondary | ICD-10-CM | POA: Diagnosis not present

## 2018-05-07 DIAGNOSIS — D61818 Other pancytopenia: Secondary | ICD-10-CM | POA: Diagnosis not present

## 2018-05-07 DIAGNOSIS — M069 Rheumatoid arthritis, unspecified: Secondary | ICD-10-CM | POA: Diagnosis not present

## 2018-05-07 DIAGNOSIS — Z7952 Long term (current) use of systemic steroids: Secondary | ICD-10-CM | POA: Diagnosis not present

## 2018-05-07 DIAGNOSIS — E43 Unspecified severe protein-calorie malnutrition: Secondary | ICD-10-CM | POA: Diagnosis not present

## 2018-05-07 DIAGNOSIS — D469 Myelodysplastic syndrome, unspecified: Secondary | ICD-10-CM | POA: Diagnosis not present

## 2018-05-07 DIAGNOSIS — E876 Hypokalemia: Secondary | ICD-10-CM | POA: Diagnosis not present

## 2018-05-07 DIAGNOSIS — I10 Essential (primary) hypertension: Secondary | ICD-10-CM | POA: Diagnosis not present

## 2018-05-07 DIAGNOSIS — K582 Mixed irritable bowel syndrome: Secondary | ICD-10-CM | POA: Diagnosis not present

## 2018-05-11 DIAGNOSIS — R300 Dysuria: Secondary | ICD-10-CM | POA: Diagnosis not present

## 2018-05-11 DIAGNOSIS — D649 Anemia, unspecified: Secondary | ICD-10-CM | POA: Diagnosis not present

## 2018-05-11 DIAGNOSIS — D461 Refractory anemia with ring sideroblasts: Secondary | ICD-10-CM | POA: Diagnosis not present

## 2018-05-12 DIAGNOSIS — Z7952 Long term (current) use of systemic steroids: Secondary | ICD-10-CM | POA: Diagnosis not present

## 2018-05-12 DIAGNOSIS — I10 Essential (primary) hypertension: Secondary | ICD-10-CM | POA: Diagnosis not present

## 2018-05-12 DIAGNOSIS — K582 Mixed irritable bowel syndrome: Secondary | ICD-10-CM | POA: Diagnosis not present

## 2018-05-12 DIAGNOSIS — D649 Anemia, unspecified: Secondary | ICD-10-CM | POA: Diagnosis not present

## 2018-05-12 DIAGNOSIS — D469 Myelodysplastic syndrome, unspecified: Secondary | ICD-10-CM | POA: Diagnosis not present

## 2018-05-12 DIAGNOSIS — E785 Hyperlipidemia, unspecified: Secondary | ICD-10-CM | POA: Diagnosis not present

## 2018-05-12 DIAGNOSIS — E876 Hypokalemia: Secondary | ICD-10-CM | POA: Diagnosis not present

## 2018-05-12 DIAGNOSIS — D61818 Other pancytopenia: Secondary | ICD-10-CM | POA: Diagnosis not present

## 2018-05-12 DIAGNOSIS — M069 Rheumatoid arthritis, unspecified: Secondary | ICD-10-CM | POA: Diagnosis not present

## 2018-05-12 DIAGNOSIS — Z8673 Personal history of transient ischemic attack (TIA), and cerebral infarction without residual deficits: Secondary | ICD-10-CM | POA: Diagnosis not present

## 2018-05-12 DIAGNOSIS — Z9049 Acquired absence of other specified parts of digestive tract: Secondary | ICD-10-CM | POA: Diagnosis not present

## 2018-05-12 DIAGNOSIS — E43 Unspecified severe protein-calorie malnutrition: Secondary | ICD-10-CM | POA: Diagnosis not present

## 2018-05-12 DIAGNOSIS — Z87440 Personal history of urinary (tract) infections: Secondary | ICD-10-CM | POA: Diagnosis not present

## 2018-05-12 DIAGNOSIS — G43909 Migraine, unspecified, not intractable, without status migrainosus: Secondary | ICD-10-CM | POA: Diagnosis not present

## 2018-05-12 DIAGNOSIS — E860 Dehydration: Secondary | ICD-10-CM | POA: Diagnosis not present

## 2018-05-12 DIAGNOSIS — Z7982 Long term (current) use of aspirin: Secondary | ICD-10-CM | POA: Diagnosis not present

## 2018-05-13 DIAGNOSIS — K582 Mixed irritable bowel syndrome: Secondary | ICD-10-CM | POA: Diagnosis not present

## 2018-05-13 DIAGNOSIS — Z7982 Long term (current) use of aspirin: Secondary | ICD-10-CM | POA: Diagnosis not present

## 2018-05-13 DIAGNOSIS — Z9049 Acquired absence of other specified parts of digestive tract: Secondary | ICD-10-CM | POA: Diagnosis not present

## 2018-05-13 DIAGNOSIS — Z87440 Personal history of urinary (tract) infections: Secondary | ICD-10-CM | POA: Diagnosis not present

## 2018-05-13 DIAGNOSIS — E860 Dehydration: Secondary | ICD-10-CM | POA: Diagnosis not present

## 2018-05-13 DIAGNOSIS — D61818 Other pancytopenia: Secondary | ICD-10-CM | POA: Diagnosis not present

## 2018-05-13 DIAGNOSIS — Z7952 Long term (current) use of systemic steroids: Secondary | ICD-10-CM | POA: Diagnosis not present

## 2018-05-13 DIAGNOSIS — Z8673 Personal history of transient ischemic attack (TIA), and cerebral infarction without residual deficits: Secondary | ICD-10-CM | POA: Diagnosis not present

## 2018-05-13 DIAGNOSIS — D649 Anemia, unspecified: Secondary | ICD-10-CM | POA: Diagnosis not present

## 2018-05-13 DIAGNOSIS — D469 Myelodysplastic syndrome, unspecified: Secondary | ICD-10-CM | POA: Diagnosis not present

## 2018-05-13 DIAGNOSIS — E876 Hypokalemia: Secondary | ICD-10-CM | POA: Diagnosis not present

## 2018-05-13 DIAGNOSIS — E43 Unspecified severe protein-calorie malnutrition: Secondary | ICD-10-CM | POA: Diagnosis not present

## 2018-05-13 DIAGNOSIS — I10 Essential (primary) hypertension: Secondary | ICD-10-CM | POA: Diagnosis not present

## 2018-05-13 DIAGNOSIS — M069 Rheumatoid arthritis, unspecified: Secondary | ICD-10-CM | POA: Diagnosis not present

## 2018-05-13 DIAGNOSIS — E785 Hyperlipidemia, unspecified: Secondary | ICD-10-CM | POA: Diagnosis not present

## 2018-05-13 DIAGNOSIS — N390 Urinary tract infection, site not specified: Secondary | ICD-10-CM | POA: Diagnosis not present

## 2018-05-13 DIAGNOSIS — G43909 Migraine, unspecified, not intractable, without status migrainosus: Secondary | ICD-10-CM | POA: Diagnosis not present

## 2018-05-17 DIAGNOSIS — E860 Dehydration: Secondary | ICD-10-CM | POA: Diagnosis not present

## 2018-05-17 DIAGNOSIS — I959 Hypotension, unspecified: Secondary | ICD-10-CM | POA: Diagnosis not present

## 2018-05-17 DIAGNOSIS — G43909 Migraine, unspecified, not intractable, without status migrainosus: Secondary | ICD-10-CM | POA: Diagnosis not present

## 2018-05-17 DIAGNOSIS — D469 Myelodysplastic syndrome, unspecified: Secondary | ICD-10-CM | POA: Diagnosis not present

## 2018-05-17 DIAGNOSIS — M069 Rheumatoid arthritis, unspecified: Secondary | ICD-10-CM | POA: Diagnosis not present

## 2018-05-17 DIAGNOSIS — E785 Hyperlipidemia, unspecified: Secondary | ICD-10-CM | POA: Diagnosis not present

## 2018-05-17 DIAGNOSIS — Z6824 Body mass index (BMI) 24.0-24.9, adult: Secondary | ICD-10-CM | POA: Diagnosis not present

## 2018-05-17 DIAGNOSIS — E876 Hypokalemia: Secondary | ICD-10-CM | POA: Diagnosis not present

## 2018-05-17 DIAGNOSIS — Z9049 Acquired absence of other specified parts of digestive tract: Secondary | ICD-10-CM | POA: Diagnosis not present

## 2018-05-17 DIAGNOSIS — Z7982 Long term (current) use of aspirin: Secondary | ICD-10-CM | POA: Diagnosis not present

## 2018-05-17 DIAGNOSIS — Z87440 Personal history of urinary (tract) infections: Secondary | ICD-10-CM | POA: Diagnosis not present

## 2018-05-17 DIAGNOSIS — Z8673 Personal history of transient ischemic attack (TIA), and cerebral infarction without residual deficits: Secondary | ICD-10-CM | POA: Diagnosis not present

## 2018-05-17 DIAGNOSIS — I10 Essential (primary) hypertension: Secondary | ICD-10-CM | POA: Diagnosis not present

## 2018-05-17 DIAGNOSIS — Z7952 Long term (current) use of systemic steroids: Secondary | ICD-10-CM | POA: Diagnosis not present

## 2018-05-17 DIAGNOSIS — K582 Mixed irritable bowel syndrome: Secondary | ICD-10-CM | POA: Diagnosis not present

## 2018-05-17 DIAGNOSIS — K5900 Constipation, unspecified: Secondary | ICD-10-CM | POA: Diagnosis not present

## 2018-05-17 DIAGNOSIS — D61818 Other pancytopenia: Secondary | ICD-10-CM | POA: Diagnosis not present

## 2018-05-17 DIAGNOSIS — D649 Anemia, unspecified: Secondary | ICD-10-CM | POA: Diagnosis not present

## 2018-05-17 DIAGNOSIS — N3000 Acute cystitis without hematuria: Secondary | ICD-10-CM | POA: Diagnosis not present

## 2018-05-17 DIAGNOSIS — E43 Unspecified severe protein-calorie malnutrition: Secondary | ICD-10-CM | POA: Diagnosis not present

## 2018-05-18 DIAGNOSIS — D461 Refractory anemia with ring sideroblasts: Secondary | ICD-10-CM | POA: Diagnosis not present

## 2018-05-18 DIAGNOSIS — Z515 Encounter for palliative care: Secondary | ICD-10-CM | POA: Diagnosis not present

## 2018-05-18 DIAGNOSIS — D469 Myelodysplastic syndrome, unspecified: Secondary | ICD-10-CM | POA: Diagnosis not present

## 2018-05-19 ENCOUNTER — Other Ambulatory Visit: Payer: Self-pay | Admitting: Neurology

## 2018-05-19 DIAGNOSIS — Z8673 Personal history of transient ischemic attack (TIA), and cerebral infarction without residual deficits: Secondary | ICD-10-CM | POA: Diagnosis not present

## 2018-05-19 DIAGNOSIS — Z9049 Acquired absence of other specified parts of digestive tract: Secondary | ICD-10-CM | POA: Diagnosis not present

## 2018-05-19 DIAGNOSIS — Z7982 Long term (current) use of aspirin: Secondary | ICD-10-CM | POA: Diagnosis not present

## 2018-05-19 DIAGNOSIS — D649 Anemia, unspecified: Secondary | ICD-10-CM | POA: Diagnosis not present

## 2018-05-19 DIAGNOSIS — E43 Unspecified severe protein-calorie malnutrition: Secondary | ICD-10-CM | POA: Diagnosis not present

## 2018-05-19 DIAGNOSIS — D469 Myelodysplastic syndrome, unspecified: Secondary | ICD-10-CM | POA: Diagnosis not present

## 2018-05-19 DIAGNOSIS — Z87440 Personal history of urinary (tract) infections: Secondary | ICD-10-CM | POA: Diagnosis not present

## 2018-05-19 DIAGNOSIS — E860 Dehydration: Secondary | ICD-10-CM | POA: Diagnosis not present

## 2018-05-19 DIAGNOSIS — I10 Essential (primary) hypertension: Secondary | ICD-10-CM | POA: Diagnosis not present

## 2018-05-19 DIAGNOSIS — D61818 Other pancytopenia: Secondary | ICD-10-CM | POA: Diagnosis not present

## 2018-05-19 DIAGNOSIS — Z7952 Long term (current) use of systemic steroids: Secondary | ICD-10-CM | POA: Diagnosis not present

## 2018-05-19 DIAGNOSIS — K582 Mixed irritable bowel syndrome: Secondary | ICD-10-CM | POA: Diagnosis not present

## 2018-05-19 DIAGNOSIS — G43909 Migraine, unspecified, not intractable, without status migrainosus: Secondary | ICD-10-CM | POA: Diagnosis not present

## 2018-05-19 DIAGNOSIS — E785 Hyperlipidemia, unspecified: Secondary | ICD-10-CM | POA: Diagnosis not present

## 2018-05-19 DIAGNOSIS — M069 Rheumatoid arthritis, unspecified: Secondary | ICD-10-CM | POA: Diagnosis not present

## 2018-05-19 DIAGNOSIS — E876 Hypokalemia: Secondary | ICD-10-CM | POA: Diagnosis not present

## 2018-05-20 NOTE — Telephone Encounter (Signed)
[facility] checked. Pt filled stadol on 2/26 and 2/24, each for a 28 day supply. Pt received hydrocodone from another provider on 03/25/2018.

## 2018-05-21 DIAGNOSIS — Z7952 Long term (current) use of systemic steroids: Secondary | ICD-10-CM | POA: Diagnosis not present

## 2018-05-21 DIAGNOSIS — E785 Hyperlipidemia, unspecified: Secondary | ICD-10-CM | POA: Diagnosis not present

## 2018-05-21 DIAGNOSIS — G43909 Migraine, unspecified, not intractable, without status migrainosus: Secondary | ICD-10-CM | POA: Diagnosis not present

## 2018-05-21 DIAGNOSIS — Z8673 Personal history of transient ischemic attack (TIA), and cerebral infarction without residual deficits: Secondary | ICD-10-CM | POA: Diagnosis not present

## 2018-05-21 DIAGNOSIS — D469 Myelodysplastic syndrome, unspecified: Secondary | ICD-10-CM | POA: Diagnosis not present

## 2018-05-21 DIAGNOSIS — Z9049 Acquired absence of other specified parts of digestive tract: Secondary | ICD-10-CM | POA: Diagnosis not present

## 2018-05-21 DIAGNOSIS — M069 Rheumatoid arthritis, unspecified: Secondary | ICD-10-CM | POA: Diagnosis not present

## 2018-05-21 DIAGNOSIS — E43 Unspecified severe protein-calorie malnutrition: Secondary | ICD-10-CM | POA: Diagnosis not present

## 2018-05-21 DIAGNOSIS — E860 Dehydration: Secondary | ICD-10-CM | POA: Diagnosis not present

## 2018-05-21 DIAGNOSIS — D649 Anemia, unspecified: Secondary | ICD-10-CM | POA: Diagnosis not present

## 2018-05-21 DIAGNOSIS — K582 Mixed irritable bowel syndrome: Secondary | ICD-10-CM | POA: Diagnosis not present

## 2018-05-21 DIAGNOSIS — E876 Hypokalemia: Secondary | ICD-10-CM | POA: Diagnosis not present

## 2018-05-21 DIAGNOSIS — D61818 Other pancytopenia: Secondary | ICD-10-CM | POA: Diagnosis not present

## 2018-05-21 DIAGNOSIS — I10 Essential (primary) hypertension: Secondary | ICD-10-CM | POA: Diagnosis not present

## 2018-05-21 DIAGNOSIS — Z7982 Long term (current) use of aspirin: Secondary | ICD-10-CM | POA: Diagnosis not present

## 2018-05-21 DIAGNOSIS — Z87440 Personal history of urinary (tract) infections: Secondary | ICD-10-CM | POA: Diagnosis not present

## 2018-05-23 ENCOUNTER — Ambulatory Visit (INDEPENDENT_AMBULATORY_CARE_PROVIDER_SITE_OTHER): Payer: Medicare Other | Admitting: *Deleted

## 2018-05-23 ENCOUNTER — Other Ambulatory Visit: Payer: Self-pay

## 2018-05-23 DIAGNOSIS — I639 Cerebral infarction, unspecified: Secondary | ICD-10-CM

## 2018-05-23 DIAGNOSIS — Z7982 Long term (current) use of aspirin: Secondary | ICD-10-CM | POA: Diagnosis not present

## 2018-05-23 DIAGNOSIS — E860 Dehydration: Secondary | ICD-10-CM | POA: Diagnosis not present

## 2018-05-23 DIAGNOSIS — D61818 Other pancytopenia: Secondary | ICD-10-CM | POA: Diagnosis not present

## 2018-05-23 DIAGNOSIS — E43 Unspecified severe protein-calorie malnutrition: Secondary | ICD-10-CM | POA: Diagnosis not present

## 2018-05-23 DIAGNOSIS — Z87440 Personal history of urinary (tract) infections: Secondary | ICD-10-CM | POA: Diagnosis not present

## 2018-05-23 DIAGNOSIS — I10 Essential (primary) hypertension: Secondary | ICD-10-CM | POA: Diagnosis not present

## 2018-05-23 DIAGNOSIS — E876 Hypokalemia: Secondary | ICD-10-CM | POA: Diagnosis not present

## 2018-05-23 DIAGNOSIS — D469 Myelodysplastic syndrome, unspecified: Secondary | ICD-10-CM | POA: Diagnosis not present

## 2018-05-23 DIAGNOSIS — Z9049 Acquired absence of other specified parts of digestive tract: Secondary | ICD-10-CM | POA: Diagnosis not present

## 2018-05-23 DIAGNOSIS — D649 Anemia, unspecified: Secondary | ICD-10-CM | POA: Diagnosis not present

## 2018-05-23 DIAGNOSIS — G43909 Migraine, unspecified, not intractable, without status migrainosus: Secondary | ICD-10-CM | POA: Diagnosis not present

## 2018-05-23 DIAGNOSIS — M069 Rheumatoid arthritis, unspecified: Secondary | ICD-10-CM | POA: Diagnosis not present

## 2018-05-23 DIAGNOSIS — Z7952 Long term (current) use of systemic steroids: Secondary | ICD-10-CM | POA: Diagnosis not present

## 2018-05-23 DIAGNOSIS — K582 Mixed irritable bowel syndrome: Secondary | ICD-10-CM | POA: Diagnosis not present

## 2018-05-23 DIAGNOSIS — E785 Hyperlipidemia, unspecified: Secondary | ICD-10-CM | POA: Diagnosis not present

## 2018-05-23 DIAGNOSIS — Z8673 Personal history of transient ischemic attack (TIA), and cerebral infarction without residual deficits: Secondary | ICD-10-CM | POA: Diagnosis not present

## 2018-05-23 LAB — CUP PACEART REMOTE DEVICE CHECK
Implantable Pulse Generator Implant Date: 20181221
MDC IDC SESS DTM: 20200322234310

## 2018-05-25 DIAGNOSIS — R05 Cough: Secondary | ICD-10-CM | POA: Diagnosis not present

## 2018-05-25 DIAGNOSIS — E876 Hypokalemia: Secondary | ICD-10-CM | POA: Diagnosis not present

## 2018-05-25 DIAGNOSIS — D61818 Other pancytopenia: Secondary | ICD-10-CM | POA: Diagnosis not present

## 2018-05-25 DIAGNOSIS — D461 Refractory anemia with ring sideroblasts: Secondary | ICD-10-CM | POA: Diagnosis not present

## 2018-05-25 DIAGNOSIS — R300 Dysuria: Secondary | ICD-10-CM | POA: Diagnosis not present

## 2018-05-25 DIAGNOSIS — R0602 Shortness of breath: Secondary | ICD-10-CM | POA: Diagnosis not present

## 2018-05-25 DIAGNOSIS — E860 Dehydration: Secondary | ICD-10-CM | POA: Diagnosis not present

## 2018-05-27 DIAGNOSIS — Z8673 Personal history of transient ischemic attack (TIA), and cerebral infarction without residual deficits: Secondary | ICD-10-CM | POA: Diagnosis not present

## 2018-05-27 DIAGNOSIS — I10 Essential (primary) hypertension: Secondary | ICD-10-CM | POA: Diagnosis not present

## 2018-05-27 DIAGNOSIS — R404 Transient alteration of awareness: Secondary | ICD-10-CM | POA: Diagnosis not present

## 2018-05-27 DIAGNOSIS — Z66 Do not resuscitate: Secondary | ICD-10-CM | POA: Diagnosis not present

## 2018-05-27 DIAGNOSIS — R0689 Other abnormalities of breathing: Secondary | ICD-10-CM | POA: Diagnosis not present

## 2018-05-27 DIAGNOSIS — I491 Atrial premature depolarization: Secondary | ICD-10-CM | POA: Diagnosis not present

## 2018-05-27 DIAGNOSIS — R069 Unspecified abnormalities of breathing: Secondary | ICD-10-CM | POA: Diagnosis not present

## 2018-05-27 DIAGNOSIS — I469 Cardiac arrest, cause unspecified: Secondary | ICD-10-CM | POA: Diagnosis not present

## 2018-05-27 DIAGNOSIS — E7800 Pure hypercholesterolemia, unspecified: Secondary | ICD-10-CM | POA: Diagnosis not present

## 2018-05-27 DIAGNOSIS — J9600 Acute respiratory failure, unspecified whether with hypoxia or hypercapnia: Secondary | ICD-10-CM | POA: Diagnosis not present

## 2018-05-30 ENCOUNTER — Telehealth: Payer: Self-pay | Admitting: Neurology

## 2018-05-30 NOTE — Telephone Encounter (Signed)
I got the message, called the husband to let him know that I was very sorry to hear this news.

## 2018-05-30 NOTE — Telephone Encounter (Signed)
Pt passed away 06-08-18

## 2018-05-30 NOTE — Telephone Encounter (Signed)
FYI

## 2018-05-31 NOTE — Progress Notes (Signed)
Carelink Summary Report / Loop Recorder 

## 2018-06-01 DEATH — deceased

## 2018-09-01 ENCOUNTER — Ambulatory Visit: Payer: Medicare Other | Admitting: Neurology

## 2019-12-10 IMAGING — MR MR MRA HEAD W/O CM
1 series · 19 of 48 positions shown · non-contrast
Comparison: MRI of the head February 17, 2017 at 7971 hours

ADDENDUM:
Acute findings discussed with and reconfirmed by Dr.Moatshe on
02/17/2017 at [DATE].
CLINICAL DATA: Diplopia for 2 months, headache, confusion and gait
instability.

EXAM:
MRA HEAD WITHOUT CONTRAST
TECHNIQUE: Angiographic images of the Circle of Willis were obtained using MRA
technique without intravenous contrast.

[Series 3: ax (id) 2 · axial · 1.0mm · 0.43mm/px · z∈[-52,+34]mm · 19 of 184 slices shown]
[im 1/184]
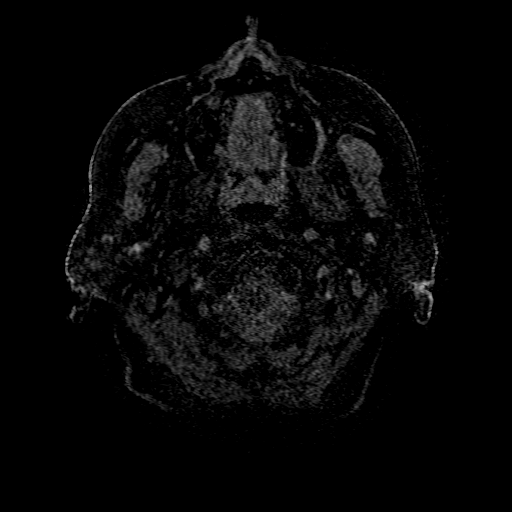
[im 4/184]
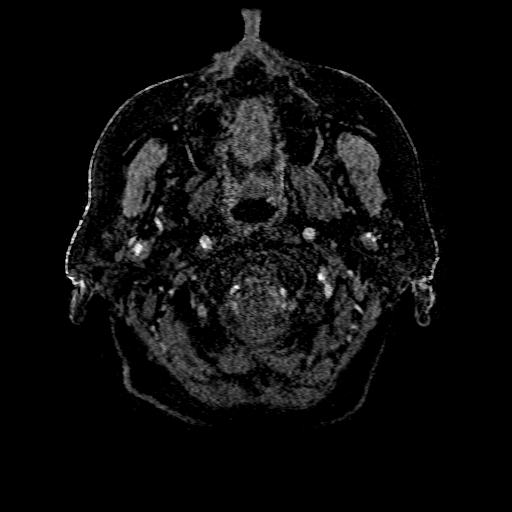
[im 8/184]
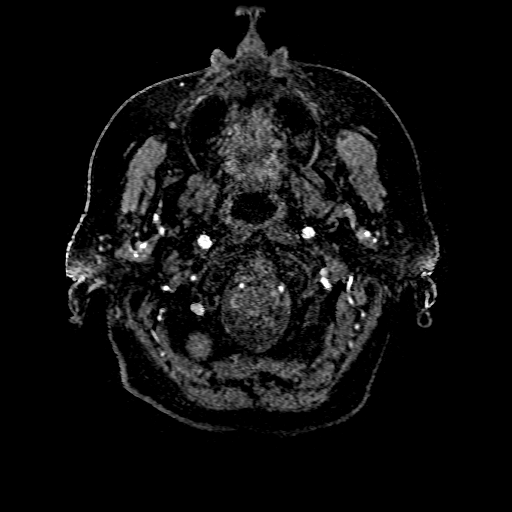
[im 12/184]
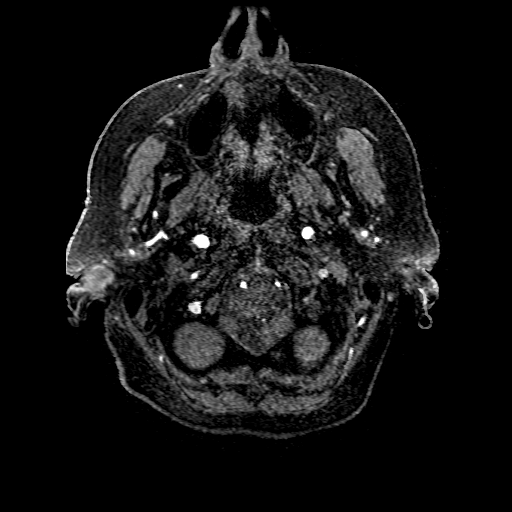
[im 16/184]
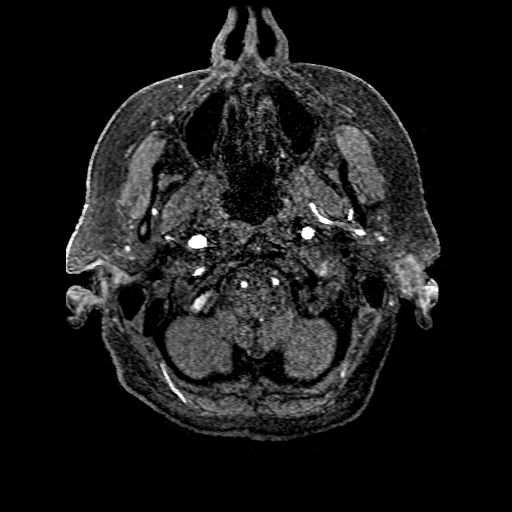
[im 20/184]
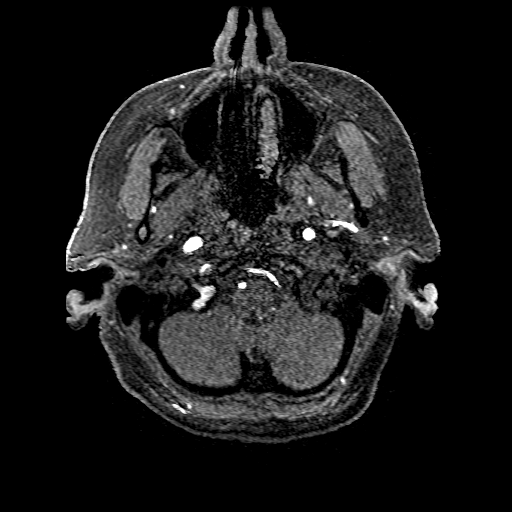
[im 24/184]
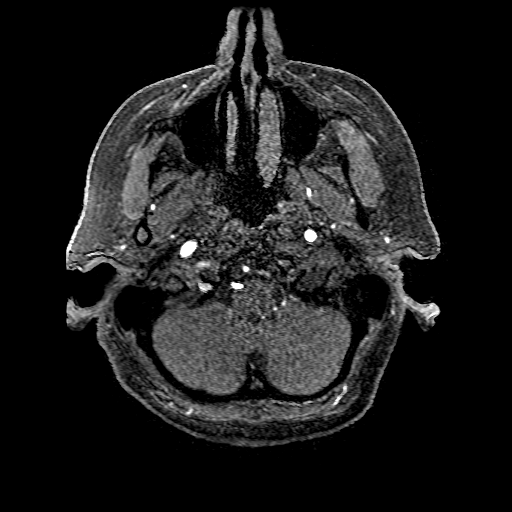
[im 28/184]
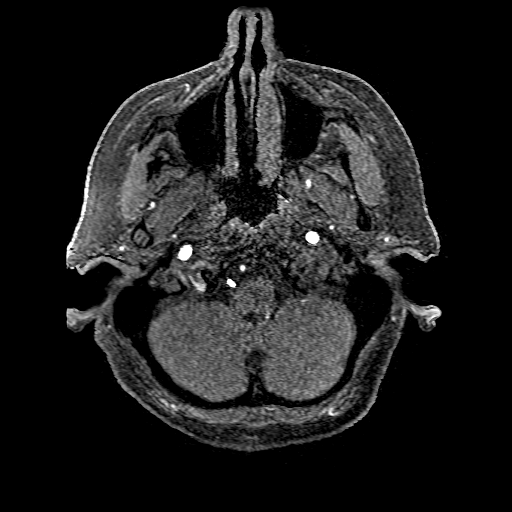
[im 32/184]
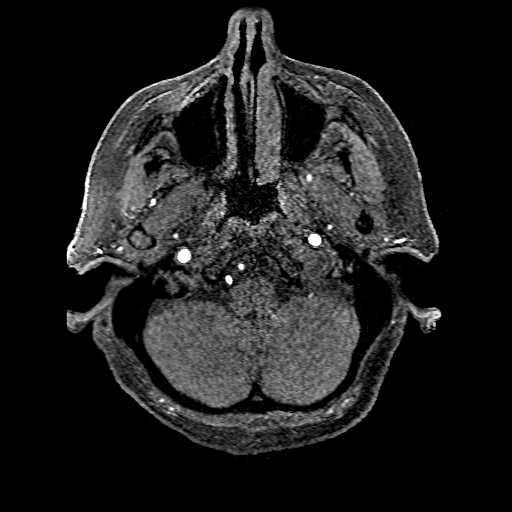
[im 36/184]
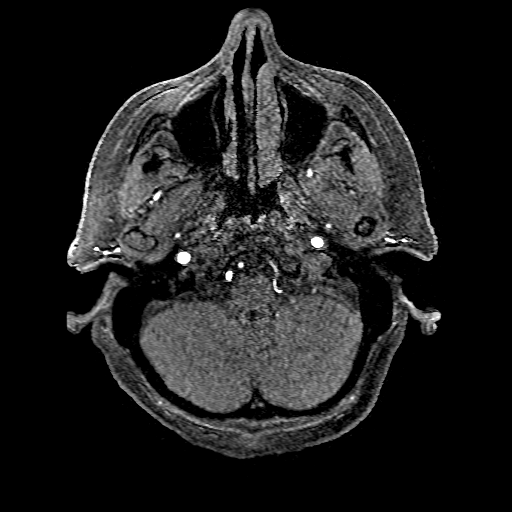
[im 39/184]
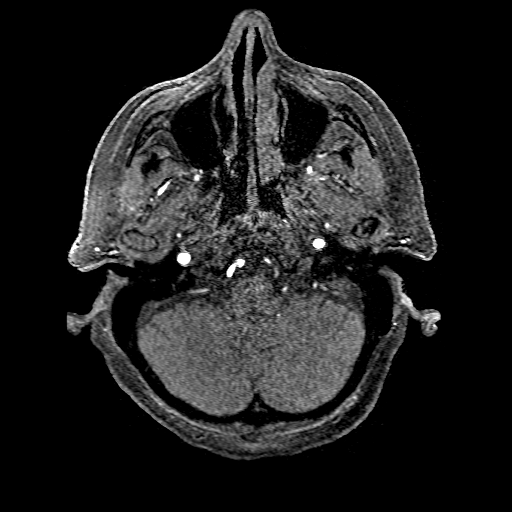
[im 59/184]
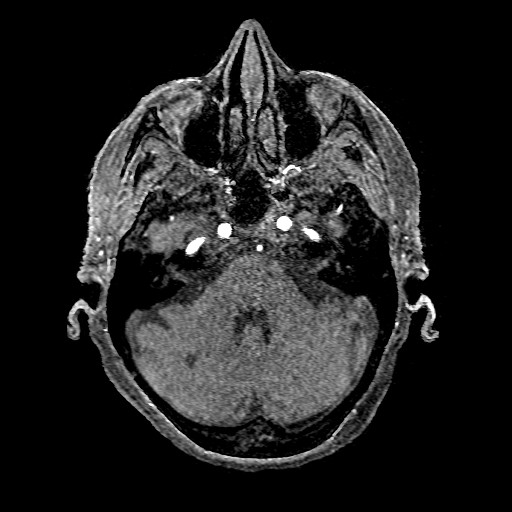
[im 82/184]
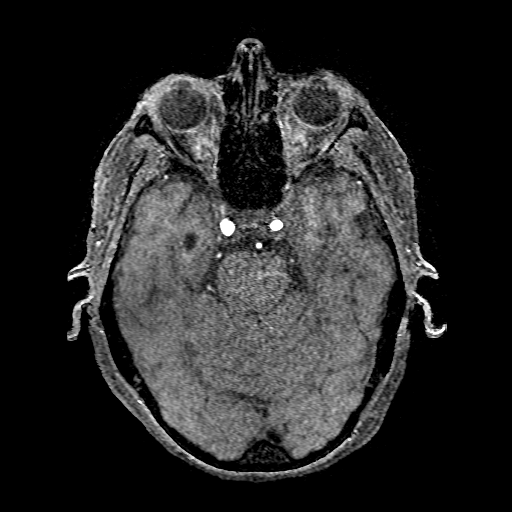
[im 94/184]
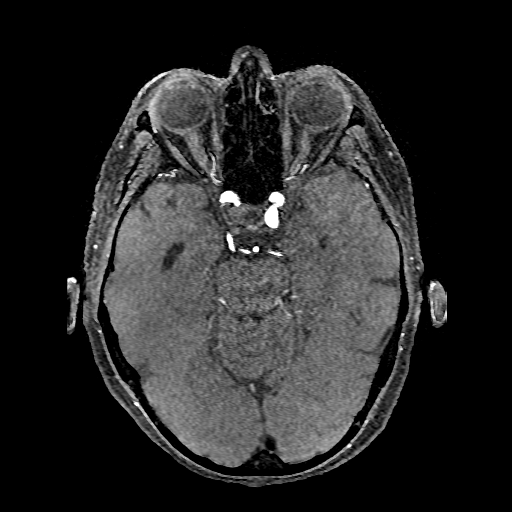
[im 106/184]
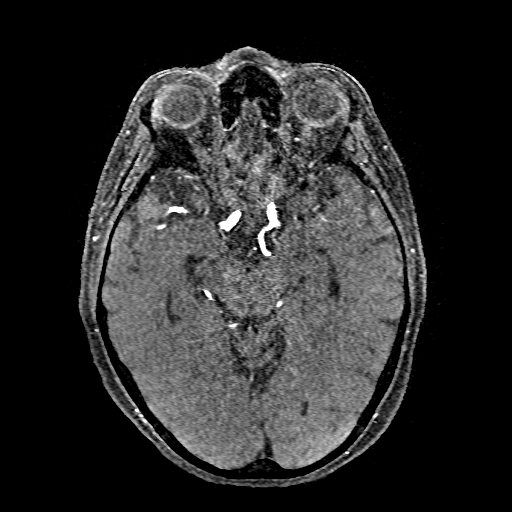
[im 129/184]
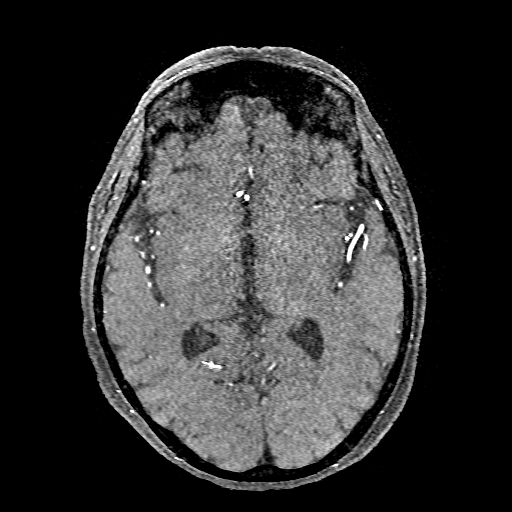
[im 152/184]
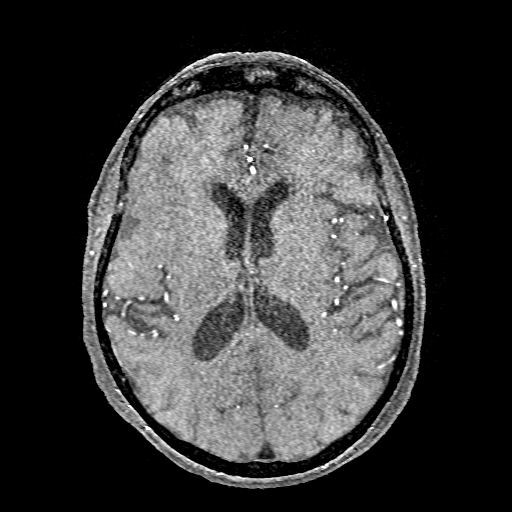
[im 156/184]
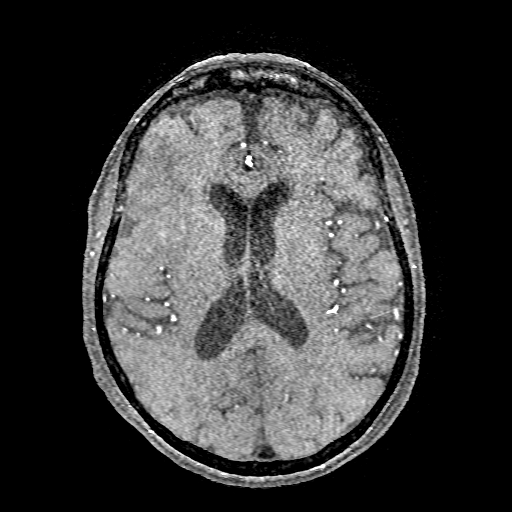
[im 176/184]
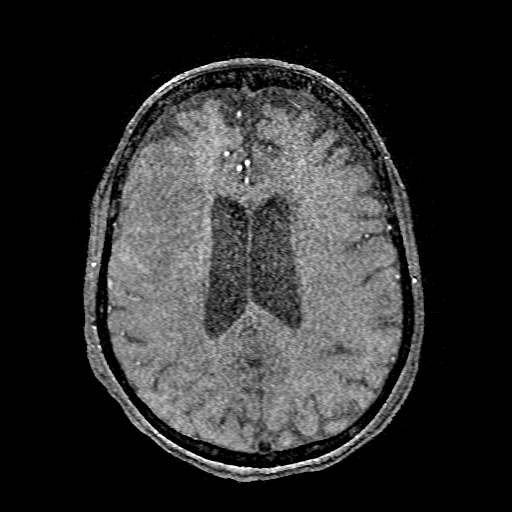

[19 of 48 positions shown; findings below may reference images not displayed]

FINDINGS: ANTERIOR CIRCULATION: Normal flow related enhancement of the
included cervical, petrous, cavernous and supraclinoid internal
carotid arteries. Patent anterior communicating artery. RIGHT
anterior M3 occlusion. Otherwise patent anterior and middle cerebral
arteries.

No large vessel occlusion, flow limiting stenosis, aneurysm.

POSTERIOR CIRCULATION: Codominant vertebral artery's with focal
stenosis bilateral vertebral artery's, at skull base. Basilar artery
is patent, with normal flow related enhancement of the main branch
vessels. Patent posterior cerebral arteries. Robust bilateral
posterior communicating artery's.

No large vessel occlusion, aneurysm.

ANATOMIC VARIANTS: Hypoplastic bilateral P1 segment.

Source images and MIP images were reviewed.
IMPRESSION: 1. RIGHT M3 occlusion.
2. Severe stenosis proximal vertebral artery's versus skullbase
artifact.
